# Patient Record
Sex: Male | Born: 1980 | Race: Black or African American | Hispanic: No | Marital: Married | Smoking: Never smoker
Health system: Southern US, Community
[De-identification: ages and names within clinical notes are randomized; demographics above are authoritative.]

## PROBLEM LIST (undated history)

## (undated) DIAGNOSIS — E559 Vitamin D deficiency, unspecified: Secondary | ICD-10-CM

## (undated) DIAGNOSIS — R519 Headache, unspecified: Secondary | ICD-10-CM

## (undated) DIAGNOSIS — F524 Premature ejaculation: Secondary | ICD-10-CM

## (undated) DIAGNOSIS — N529 Male erectile dysfunction, unspecified: Secondary | ICD-10-CM

## (undated) DIAGNOSIS — R51 Headache: Principal | ICD-10-CM

## (undated) DIAGNOSIS — Z8619 Personal history of other infectious and parasitic diseases: Secondary | ICD-10-CM

## (undated) DIAGNOSIS — Z9884 Bariatric surgery status: Secondary | ICD-10-CM

## (undated) DIAGNOSIS — E538 Deficiency of other specified B group vitamins: Secondary | ICD-10-CM

## (undated) DIAGNOSIS — G4733 Obstructive sleep apnea (adult) (pediatric): Secondary | ICD-10-CM

## (undated) HISTORY — DX: Personal history of other infectious and parasitic diseases: Z86.19

## (undated) HISTORY — DX: Vitamin D deficiency, unspecified: E55.9

## (undated) HISTORY — DX: Premature ejaculation: F52.4

## (undated) HISTORY — DX: Deficiency of other specified B group vitamins: E53.8

## (undated) HISTORY — DX: Headache, unspecified: R51.9

## (undated) HISTORY — DX: Headache: R51

## (undated) HISTORY — DX: Male erectile dysfunction, unspecified: N52.9

## (undated) HISTORY — DX: Obstructive sleep apnea (adult) (pediatric): G47.33

## (undated) HISTORY — DX: Bariatric surgery status: Z98.84

---

## 2004-06-15 ENCOUNTER — Emergency Department (HOSPITAL_COMMUNITY): Admission: EM | Admit: 2004-06-15 | Discharge: 2004-06-15 | Payer: Self-pay | Admitting: Family Medicine

## 2004-10-17 ENCOUNTER — Ambulatory Visit: Payer: Self-pay | Admitting: Family Medicine

## 2007-08-05 ENCOUNTER — Emergency Department (HOSPITAL_COMMUNITY): Admission: EM | Admit: 2007-08-05 | Discharge: 2007-08-05 | Payer: Self-pay | Admitting: Emergency Medicine

## 2008-01-02 ENCOUNTER — Emergency Department (HOSPITAL_COMMUNITY): Admission: EM | Admit: 2008-01-02 | Discharge: 2008-01-02 | Payer: Self-pay | Admitting: Emergency Medicine

## 2008-05-14 DIAGNOSIS — Z9884 Bariatric surgery status: Secondary | ICD-10-CM

## 2008-05-14 HISTORY — DX: Bariatric surgery status: Z98.84

## 2008-05-14 HISTORY — PX: GASTRIC BYPASS: SHX52

## 2008-12-01 ENCOUNTER — Encounter: Admission: RE | Admit: 2008-12-01 | Discharge: 2009-03-01 | Payer: Self-pay | Admitting: General Surgery

## 2008-12-09 ENCOUNTER — Ambulatory Visit (HOSPITAL_COMMUNITY): Admission: RE | Admit: 2008-12-09 | Discharge: 2008-12-09 | Payer: Self-pay | Admitting: General Surgery

## 2008-12-23 ENCOUNTER — Encounter: Admission: RE | Admit: 2008-12-23 | Discharge: 2008-12-23 | Payer: Self-pay | Admitting: General Surgery

## 2009-04-14 ENCOUNTER — Encounter: Admission: RE | Admit: 2009-04-14 | Discharge: 2009-05-11 | Payer: Self-pay | Admitting: General Surgery

## 2009-05-02 ENCOUNTER — Inpatient Hospital Stay (HOSPITAL_COMMUNITY): Admission: RE | Admit: 2009-05-02 | Discharge: 2009-05-04 | Payer: Self-pay | Admitting: General Surgery

## 2009-05-03 ENCOUNTER — Ambulatory Visit: Payer: Self-pay | Admitting: Vascular Surgery

## 2009-05-03 ENCOUNTER — Encounter (INDEPENDENT_AMBULATORY_CARE_PROVIDER_SITE_OTHER): Payer: Self-pay | Admitting: General Surgery

## 2009-05-16 ENCOUNTER — Encounter: Admission: RE | Admit: 2009-05-16 | Discharge: 2009-08-14 | Payer: Self-pay | Admitting: General Surgery

## 2009-11-09 ENCOUNTER — Encounter: Admission: RE | Admit: 2009-11-09 | Discharge: 2009-11-09 | Payer: Self-pay | Admitting: General Surgery

## 2010-04-26 ENCOUNTER — Encounter
Admission: RE | Admit: 2010-04-26 | Discharge: 2010-06-13 | Payer: Self-pay | Source: Home / Self Care | Attending: General Surgery | Admitting: General Surgery

## 2010-05-14 DIAGNOSIS — G4733 Obstructive sleep apnea (adult) (pediatric): Secondary | ICD-10-CM

## 2010-05-14 HISTORY — DX: Obstructive sleep apnea (adult) (pediatric): G47.33

## 2010-08-14 LAB — CBC
MCHC: 32.5 g/dL (ref 30.0–36.0)
MCV: 80.3 fL (ref 78.0–100.0)
Platelets: 237 10*3/uL (ref 150–400)
Platelets: 261 10*3/uL (ref 150–400)
RDW: 15 % (ref 11.5–15.5)
WBC: 8.6 10*3/uL (ref 4.0–10.5)
WBC: 8.9 10*3/uL (ref 4.0–10.5)

## 2010-08-14 LAB — DIFFERENTIAL
Basophils Absolute: 0 10*3/uL (ref 0.0–0.1)
Basophils Relative: 0 % (ref 0–1)
Eosinophils Absolute: 0 10*3/uL (ref 0.0–0.7)
Lymphocytes Relative: 20 % (ref 12–46)
Lymphs Abs: 1.7 10*3/uL (ref 0.7–4.0)
Neutro Abs: 5.8 10*3/uL (ref 1.7–7.7)
Neutro Abs: 6.1 10*3/uL (ref 1.7–7.7)
Neutrophils Relative %: 68 % (ref 43–77)

## 2010-08-14 LAB — HEMOGLOBIN AND HEMATOCRIT, BLOOD
HCT: 38.6 % — ABNORMAL LOW (ref 39.0–52.0)
Hemoglobin: 12.4 g/dL — ABNORMAL LOW (ref 13.0–17.0)
Hemoglobin: 12.9 g/dL — ABNORMAL LOW (ref 13.0–17.0)

## 2010-08-15 LAB — CBC
HCT: 43.3 % (ref 39.0–52.0)
Hemoglobin: 14.3 g/dL (ref 13.0–17.0)
MCHC: 33 g/dL (ref 30.0–36.0)
MCV: 80.6 fL (ref 78.0–100.0)
Platelets: 316 10*3/uL (ref 150–400)
RBC: 5.37 MIL/uL (ref 4.22–5.81)
RDW: 15 % (ref 11.5–15.5)
WBC: 7.3 10*3/uL (ref 4.0–10.5)

## 2010-08-15 LAB — COMPREHENSIVE METABOLIC PANEL
ALT: 31 U/L (ref 0–53)
AST: 28 U/L (ref 0–37)
Albumin: 3.7 g/dL (ref 3.5–5.2)
Alkaline Phosphatase: 61 U/L (ref 39–117)
BUN: 15 mg/dL (ref 6–23)
CO2: 28 mEq/L (ref 19–32)
Calcium: 9.6 mg/dL (ref 8.4–10.5)
Chloride: 105 mEq/L (ref 96–112)
Creatinine, Ser: 0.84 mg/dL (ref 0.4–1.5)
GFR calc Af Amer: >60 mL/min (ref >60)
GFR calc non Af Amer: >60 mL/min (ref >60)
Glucose, Bld: 96 mg/dL (ref 70–99)
Potassium: 4.5 mEq/L (ref 3.5–5.1)
Sodium: 138 mEq/L (ref 135–145)
Total Bilirubin: 0.7 mg/dL (ref 0.3–1.2)
Total Protein: 7.5 g/dL (ref 6.0–8.3)

## 2010-08-15 LAB — DIFFERENTIAL
Basophils Absolute: 0 10*3/uL (ref 0.0–0.1)
Basophils Relative: 0 % (ref 0–1)
Monocytes Absolute: 0.5 10*3/uL (ref 0.1–1.0)
Neutro Abs: 4.8 10*3/uL (ref 1.7–7.7)

## 2012-02-04 ENCOUNTER — Telehealth (INDEPENDENT_AMBULATORY_CARE_PROVIDER_SITE_OTHER): Payer: Self-pay | Admitting: General Surgery

## 2012-02-04 NOTE — Telephone Encounter (Signed)
I left a voicemail for the patient and requested patient contact [facility] at [phone] to schedule a bariatric surgery follow-up appointment...cef °

## 2012-07-09 ENCOUNTER — Encounter (HOSPITAL_COMMUNITY): Payer: Self-pay | Admitting: Emergency Medicine

## 2012-07-09 ENCOUNTER — Emergency Department (HOSPITAL_COMMUNITY)
Admission: EM | Admit: 2012-07-09 | Discharge: 2012-07-09 | Disposition: A | Discharge status: Home / Self Care | Payer: BC Managed Care – PPO | Attending: Emergency Medicine | Admitting: Emergency Medicine

## 2012-07-09 DIAGNOSIS — Z79899 Other long term (current) drug therapy: Secondary | ICD-10-CM | POA: Insufficient documentation

## 2012-07-09 DIAGNOSIS — F528 Other sexual dysfunction not due to a substance or known physiological condition: Secondary | ICD-10-CM | POA: Insufficient documentation

## 2012-07-09 DIAGNOSIS — Z9884 Bariatric surgery status: Secondary | ICD-10-CM | POA: Insufficient documentation

## 2012-07-09 DIAGNOSIS — N4830 Priapism, unspecified: Secondary | ICD-10-CM | POA: Insufficient documentation

## 2012-07-09 MED ORDER — PHENYLEPHRINE 200 MCG/ML FOR PRIAPISM / HYPOTENSION
INTRAMUSCULAR | Status: AC
  Administered 2012-07-09: 06:00:00
  Filled 2012-07-09: qty 50

## 2012-07-09 NOTE — ED Provider Notes (Signed)
History     CSN: [id number]  Arrival date & time 07/09/12  1191   First MD Initiated Contact with Patient 07/09/12 [phone]      Chief Complaint  Patient presents with  . Priapism      The history is provided by the patient.   the patient reports taking Viagra for erectile dysfunction approximately 6 hours ago and now he continues to have erections that is painful.  His pain is mild to moderate in severity.  He denies ingestion of other medications.  No history of sickle cell disease.  Nothing improves or worsens the symptoms.   History reviewed. No pertinent past medical history.  Past Surgical History  Procedure Laterality Date  . Gastric bybass  2010    History reviewed. No pertinent family history.  History  Substance Use Topics  . Smoking status: Never Smoker   . Smokeless tobacco: Not on file  . Alcohol Use: No      Review of Systems  All other systems reviewed and are negative.    Allergies  Review of patient's allergies indicates no known allergies.  Home Medications   Current Outpatient Rx  Name  Route  Sig  Dispense  Refill  . sildenafil (VIAGRA) 100 MG tablet   Oral   Take 100 mg by mouth daily as needed for erectile dysfunction.           BP 142/91  Pulse 51  Temp(Src) 99.1 F (37.3 C) (Oral)  Resp 19  Ht 5\' 10"  (1.778 m)  Wt 245 lb (111.131 kg)  BMI 35.15 kg/m2  SpO2 100%  Physical Exam  Nursing note and vitals reviewed. Constitutional: He is oriented to person, place, and time. He appears well-developed and well-nourished.  HENT:  Head: Normocephalic and atraumatic.  Eyes: EOM are normal.  Neck: Normal range of motion.  Cardiovascular: Normal rate, regular rhythm, normal heart sounds and intact distal pulses.   Pulmonary/Chest: Effort normal. No respiratory distress.  Abdominal: Soft.  Genitourinary:  erect nontender penis  Musculoskeletal: Normal range of motion.  Neurological: He is alert and oriented to person, place, and  time.  Skin: Skin is warm and dry.  Psychiatric: He has a normal mood and affect. Judgment normal.    ED Course  Procedures (including critical care time)  PRIAPISM TREATMENT (injection) Patient was prepped and draped in standard sterile fashion Just prior to procedure a timeout was performed Left corpus cavernosum was entered with a 25-gauge syringe Blood was aspirated 1 cc of 200 mcg/ml phenylephrine was injected The patient tolerated the procedure No complications   Labs Reviewed - No data to display No results found.   1. Priapism       MDM  7:00 AM Pt tolerated the procedure and has had complete detumescence of his penis.  Discharge home with urology followup.         Lyanne Co, MD 07/09/12 [phone]

## 2012-07-09 NOTE — ED Notes (Signed)
Pt discharged.Vital signs stable and GCS 15 

## 2012-07-09 NOTE — ED Notes (Signed)
Pt took one 50 mg tablet of viagra at midnight. Pt states his erection has not decreased and pain started around 3:30.. Pt states this has never happened before.

## 2013-01-21 ENCOUNTER — Telehealth: Payer: Self-pay | Admitting: Family Medicine

## 2013-01-21 NOTE — Telephone Encounter (Signed)
Pt is Natasha's ([facility]) and is trying to est w/you as a new pt. Your 1st new pt apptmt is not until February 19, 2013, but he is having headaches and wants to know if he can be seen sooner.  Can you accommodate him a new pt apptmt prior to 02/19/2013? Thank you.

## 2013-01-22 NOTE — Telephone Encounter (Signed)
Sch pt for 01/26/2013

## 2013-01-22 NOTE — Telephone Encounter (Signed)
May place at 2:15 on Monday 9/15 for 30 min slot.

## 2013-01-26 ENCOUNTER — Ambulatory Visit (INDEPENDENT_AMBULATORY_CARE_PROVIDER_SITE_OTHER): Payer: BC Managed Care – PPO | Admitting: Family Medicine

## 2013-01-26 ENCOUNTER — Encounter: Payer: Self-pay | Admitting: Family Medicine

## 2013-01-26 VITALS — BP 130/90 | HR 82 | Temp 98.4°F | Ht 69.5 in | Wt 257.0 lb

## 2013-01-26 DIAGNOSIS — R519 Headache, unspecified: Secondary | ICD-10-CM

## 2013-01-26 DIAGNOSIS — R42 Dizziness and giddiness: Secondary | ICD-10-CM

## 2013-01-26 DIAGNOSIS — G43909 Migraine, unspecified, not intractable, without status migrainosus: Secondary | ICD-10-CM | POA: Insufficient documentation

## 2013-01-26 DIAGNOSIS — R51 Headache: Secondary | ICD-10-CM

## 2013-01-26 DIAGNOSIS — Z9884 Bariatric surgery status: Secondary | ICD-10-CM

## 2013-01-26 DIAGNOSIS — E669 Obesity, unspecified: Secondary | ICD-10-CM

## 2013-01-26 DIAGNOSIS — Z23 Encounter for immunization: Secondary | ICD-10-CM

## 2013-01-26 LAB — COMPREHENSIVE METABOLIC PANEL
ALT: 17 U/L (ref 0–53)
AST: 22 U/L (ref 0–37)
Calcium: 8.8 mg/dL (ref 8.4–10.5)
Chloride: 110 mEq/L (ref 96–112)
Creatinine, Ser: 1 mg/dL (ref 0.4–1.5)
Total Bilirubin: 0.4 mg/dL (ref 0.3–1.2)

## 2013-01-26 LAB — CBC WITH DIFFERENTIAL/PLATELET
Basophils Relative: 0.4 % (ref 0.0–3.0)
Eosinophils Absolute: 0.1 10*3/uL (ref 0.0–0.7)
HCT: 39.3 % (ref 39.0–52.0)
Hemoglobin: 13.2 g/dL (ref 13.0–17.0)
Lymphs Abs: 2.1 10*3/uL (ref 0.7–4.0)
MCHC: 33.7 g/dL (ref 30.0–36.0)
MCV: 83.8 fl (ref 78.0–100.0)
Monocytes Absolute: 0.6 10*3/uL (ref 0.1–1.0)
Neutro Abs: 4.6 10*3/uL (ref 1.4–7.7)
RBC: 4.69 Mil/uL (ref 4.22–5.81)

## 2013-01-26 MED ORDER — CYCLOBENZAPRINE HCL 10 MG PO TABS: 10.0000 mg | ORAL_TABLET | Freq: Three times a day (TID) | ORAL | Status: DC | PRN

## 2013-01-26 NOTE — Addendum Note (Signed)
Addended by: Josph Macho A on: 01/26/2013 03:15 PM   Modules accepted: Orders

## 2013-01-26 NOTE — Patient Instructions (Addendum)
Let's keep an eye on blood pressure when you go to local pharmacy - goal <140/90, let me know if consistently staying higher. Headache could be due to several factors . Start with decreasing sertraline to 50mg  daily (1/2 tablet).  Then fill new prescription for glasses. May use flexeril 10mg  as needed for headache (1/2 to 1 tablet at a time - sedation precautions) Return in 3-4 months for physical and f/u headaches. Sign release of records from [place].

## 2013-01-26 NOTE — Assessment & Plan Note (Signed)
Check orthostatics today. Sound more like lightheadedness.

## 2013-01-26 NOTE — Assessment & Plan Note (Signed)
?  atypical migraine vs SSRI related vs vision related vs less likely TTH (as no neck pain).  Pt states sleep study after bypass WNL Check blood work today Trial of flexeril muscle relaxant prn headache - discussed sedation precautions.

## 2013-01-26 NOTE — Assessment & Plan Note (Signed)
Check vitamin levels 

## 2013-01-26 NOTE — Progress Notes (Signed)
Subjective:    Patient ID: Vincent Diaz, male    DOB: [birth year], 32 y.o.   MRN: [id number]  HPI CC: new patient to establish  Prior saw Dr. Cheri Guppy at [facility]  HA - longterm, increased frequency over last 6 months. Describes pounding bilateral temporoparietal headache.  Photo and phonophobia.  At times wakes up with headaches. No nausea. No vision changes.  Improved with ibuprofen - 800mg .  One headache every other week.  Pseudophed with benadryl helps some.  Rest helps the most.  Last 1-4 days.  Activity limiting headache. Has been more active in heat recently. No significant caffeine use.  Rare sodas.  Sleeping 6-8 hours regularly.  No neck pain. L eye astigmatist.  Last saw eye doctor a few months ago.  New prescription but he hasn't filled yet.  Dizziness - may come on intermittently.  Described as "wobbly and woozy" with blurred vision - lightheaded.  No vertigo sxs.  No presyncope or syncope.  Not positional dizziness.  Doesn't wake up feeling rested. Doesn't snore. H/o OSA - improved with surgery.  Had 2nd normal sleep study.  Wt Readings from Last 3 Encounters:  01/26/13 257 lb (116.574 kg)  07/09/12 245 lb (111.131 kg)  10lb weight gain over last few months.  Preventative:  Due for CPE, blood work > 1 yr ago Tetanus - unsure, thinks within last 10 yrs Flu shot - declines.  Lives with wife and 2 children (2, 5, 5, 8) Occupation: Radio producer (Paediatric nurse) Edu: Masters in IT Activity: coaches youth football (8yo), some working out  Diet: some water, fruits/vegetables daily  Medications and allergies reviewed and updated in chart.  Past histories reviewed and updated if relevant as below. There are no active problems to display for this patient.  Past Medical History  Diagnosis Date  . History of chicken pox   . Headache   . Premature ejaculation     Dr. Evelene Croon   Past Surgical History  Procedure Laterality Date  . Gastric bypass  2010    Roux en  Y Jeanella Flattery)   History  Substance Use Topics  . Smoking status: Never Smoker   . Smokeless tobacco: Never Used  . Alcohol Use: Yes     Comment: seldom   Family History  Problem Relation Age of Onset  . Hypertension Paternal Grandmother   . Cancer Maternal Aunt     breast  . Drug abuse Mother     h/o  . CAD Neg Hx   . Stroke Neg Hx   . Diabetes Neg Hx    No Known Allergies No current outpatient prescriptions on file prior to visit.   No current facility-administered medications on file prior to visit.     Review of Systems  Constitutional: Negative for fever, chills, activity change, appetite change, fatigue and unexpected weight change.  HENT: Negative for hearing loss and neck pain.   Eyes: Negative for visual disturbance.  Respiratory: Negative for cough, chest tightness, shortness of breath and wheezing.   Cardiovascular: Negative for chest pain, palpitations and leg swelling.  Gastrointestinal: Negative for nausea, vomiting, abdominal pain, diarrhea, constipation, blood in stool and abdominal distention.  Genitourinary: Negative for hematuria and difficulty urinating.  Musculoskeletal: Negative for myalgias and arthralgias.  Skin: Negative for rash.  Neurological: Positive for dizziness, light-headedness and headaches (frequent - see HPI). Negative for seizures and syncope.  Hematological: Negative for adenopathy. Does not bruise/bleed easily.  Psychiatric/Behavioral: Negative for dysphoric mood. The patient is not  nervous/anxious.        Objective:   Physical Exam  Nursing note and vitals reviewed. Constitutional: He is oriented to person, place, and time. He appears well-developed and well-nourished. No distress.  HENT:  Head: Normocephalic and atraumatic.  Right Ear: Hearing, tympanic membrane, external ear and ear canal normal.  Left Ear: Hearing, tympanic membrane, external ear and ear canal normal.  Nose: Nose normal.  Mouth/Throat: Oropharynx is clear  and moist. No oropharyngeal exudate.  Eyes: Conjunctivae and EOM are normal. Pupils are equal, round, and reactive to light. No scleral icterus.  Neck: Normal range of motion. Neck supple. No thyromegaly present.  Cardiovascular: Normal rate, regular rhythm, normal heart sounds and intact distal pulses.   No murmur heard. Pulses:      Radial pulses are 2+ on the right side, and 2+ on the left side.  Pulmonary/Chest: Effort normal and breath sounds normal. No respiratory distress. He has no wheezes. He has no rales.  Musculoskeletal: Normal range of motion. He exhibits no edema.  Lymphadenopathy:    He has no cervical adenopathy.  Neurological: He is alert and oriented to person, place, and time. He has normal strength. No cranial nerve deficit or sensory deficit. He displays a negative Romberg sign. Coordination and gait normal.  CN grossly intact, station and gait intact Normal FTN  Skin: Skin is warm and dry. No rash noted.  Psychiatric: He has a normal mood and affect. His behavior is normal. Judgment and thought content normal.       Assessment & Plan:

## 2013-01-26 NOTE — Assessment & Plan Note (Signed)
Body mass index is 37.42 kg/(m^2). will need to closely monitor weight after bypass - weight gain noted.

## 2013-01-27 ENCOUNTER — Other Ambulatory Visit: Payer: Self-pay | Admitting: Family Medicine

## 2013-01-27 ENCOUNTER — Encounter: Payer: Self-pay | Admitting: Family Medicine

## 2013-01-27 DIAGNOSIS — E559 Vitamin D deficiency, unspecified: Secondary | ICD-10-CM | POA: Insufficient documentation

## 2013-01-27 DIAGNOSIS — E538 Deficiency of other specified B group vitamins: Secondary | ICD-10-CM | POA: Insufficient documentation

## 2013-01-27 DIAGNOSIS — F524 Premature ejaculation: Secondary | ICD-10-CM | POA: Insufficient documentation

## 2013-01-27 MED ORDER — VITAMIN D 50 MCG (2000 UT) PO CAPS: 1.0000 | ORAL_CAPSULE | Freq: Every day | ORAL | Status: DC

## 2013-01-29 ENCOUNTER — Ambulatory Visit (INDEPENDENT_AMBULATORY_CARE_PROVIDER_SITE_OTHER): Payer: BC Managed Care – PPO | Admitting: *Deleted

## 2013-01-29 DIAGNOSIS — E538 Deficiency of other specified B group vitamins: Secondary | ICD-10-CM

## 2013-01-29 MED ORDER — CYANOCOBALAMIN 1000 MCG/ML IJ SOLN
1000.0000 ug | Freq: Once | INTRAMUSCULAR | Status: AC
  Administered 2013-01-29: 1000 ug via INTRAMUSCULAR

## 2013-02-25 ENCOUNTER — Encounter: Payer: Self-pay | Admitting: Family Medicine

## 2013-03-03 ENCOUNTER — Ambulatory Visit (INDEPENDENT_AMBULATORY_CARE_PROVIDER_SITE_OTHER): Payer: BC Managed Care – PPO | Admitting: *Deleted

## 2013-03-03 DIAGNOSIS — E538 Deficiency of other specified B group vitamins: Secondary | ICD-10-CM

## 2013-03-03 MED ORDER — CYANOCOBALAMIN 1000 MCG/ML IJ SOLN
1000.0000 ug | Freq: Once | INTRAMUSCULAR | Status: AC
  Administered 2013-03-03: 1000 ug via INTRAMUSCULAR

## 2013-04-21 ENCOUNTER — Emergency Department (HOSPITAL_COMMUNITY)
Admission: EM | Admit: 2013-04-21 | Discharge: 2013-04-21 | Disposition: A | Discharge status: Home / Self Care | Payer: BC Managed Care – PPO | Attending: Emergency Medicine | Admitting: Emergency Medicine

## 2013-04-21 ENCOUNTER — Encounter: Payer: Self-pay | Admitting: Cardiology

## 2013-04-21 ENCOUNTER — Emergency Department (HOSPITAL_COMMUNITY): Payer: BC Managed Care – PPO

## 2013-04-21 ENCOUNTER — Encounter (HOSPITAL_COMMUNITY): Payer: Self-pay | Admitting: Emergency Medicine

## 2013-04-21 DIAGNOSIS — Z8639 Personal history of other endocrine, nutritional and metabolic disease: Secondary | ICD-10-CM | POA: Insufficient documentation

## 2013-04-21 DIAGNOSIS — R079 Chest pain, unspecified: Secondary | ICD-10-CM | POA: Insufficient documentation

## 2013-04-21 DIAGNOSIS — Z9884 Bariatric surgery status: Secondary | ICD-10-CM | POA: Insufficient documentation

## 2013-04-21 DIAGNOSIS — Z79899 Other long term (current) drug therapy: Secondary | ICD-10-CM | POA: Insufficient documentation

## 2013-04-21 DIAGNOSIS — R6883 Chills (without fever): Secondary | ICD-10-CM | POA: Insufficient documentation

## 2013-04-21 DIAGNOSIS — R42 Dizziness and giddiness: Secondary | ICD-10-CM | POA: Insufficient documentation

## 2013-04-21 DIAGNOSIS — Z8619 Personal history of other infectious and parasitic diseases: Secondary | ICD-10-CM | POA: Insufficient documentation

## 2013-04-21 DIAGNOSIS — R059 Cough, unspecified: Secondary | ICD-10-CM | POA: Insufficient documentation

## 2013-04-21 DIAGNOSIS — R05 Cough: Secondary | ICD-10-CM | POA: Insufficient documentation

## 2013-04-21 DIAGNOSIS — G4733 Obstructive sleep apnea (adult) (pediatric): Secondary | ICD-10-CM | POA: Insufficient documentation

## 2013-04-21 LAB — COMPREHENSIVE METABOLIC PANEL
AST: 19 U/L (ref 0–37)
Albumin: 3.5 g/dL (ref 3.5–5.2)
BUN: 15 mg/dL (ref 6–23)
Chloride: 104 mEq/L (ref 96–112)
Creatinine, Ser: 0.99 mg/dL (ref 0.50–1.35)
GFR calc non Af Amer: >90 mL/min (ref >90)
Potassium: 3.9 mEq/L (ref 3.5–5.1)
Total Bilirubin: 0.4 mg/dL (ref 0.3–1.2)
Total Protein: 6.6 g/dL (ref 6.0–8.3)

## 2013-04-21 LAB — CBC WITH DIFFERENTIAL/PLATELET
Eosinophils Absolute: 0 10*3/uL (ref 0.0–0.7)
Eosinophils Relative: 1 % (ref 0–5)
HCT: 38 % — ABNORMAL LOW (ref 39.0–52.0)
Lymphocytes Relative: 21 % (ref 12–46)
Lymphs Abs: 1.6 10*3/uL (ref 0.7–4.0)
MCH: 27.9 pg (ref 26.0–34.0)
MCV: 83 fL (ref 78.0–100.0)
Monocytes Absolute: 1 10*3/uL (ref 0.1–1.0)
Monocytes Relative: 14 % — ABNORMAL HIGH (ref 3–12)
Neutro Abs: 4.9 10*3/uL (ref 1.7–7.7)
Platelets: 253 10*3/uL (ref 150–400)
RBC: 4.58 MIL/uL (ref 4.22–5.81)
WBC: 7.5 10*3/uL (ref 4.0–10.5)

## 2013-04-21 LAB — POCT I-STAT TROPONIN I: Troponin i, poc: 0.01 ng/mL (ref 0.00–0.08)

## 2013-04-21 MED ORDER — IBUPROFEN 800 MG PO TABS: 800.0000 mg | ORAL_TABLET | Freq: Three times a day (TID) | ORAL | Status: DC

## 2013-04-21 MED ORDER — FENTANYL CITRATE 0.05 MG/ML IJ SOLN
50.0000 ug | INTRAMUSCULAR | Status: DC | PRN
  Administered 2013-04-21: 50 ug via INTRAVENOUS
  Filled 2013-04-21: qty 2

## 2013-04-21 MED ORDER — PROMETHAZINE HCL 25 MG/ML IJ SOLN
25.0000 mg | Freq: Once | INTRAMUSCULAR | Status: AC
  Administered 2013-04-21: 25 mg via INTRAVENOUS
  Filled 2013-04-21: qty 1

## 2013-04-21 NOTE — ED Provider Notes (Signed)
CSN: [id number]     Arrival date & time 04/21/13  0416 History   First MD Initiated Contact with Patient 04/21/13 0445     Chief Complaint  Patient presents with  . Chest Pain  . Shortness of Breath  . Dizziness  . Cough  . Chills   (Consider location/radiation/quality/duration/timing/severity/associated sxs/prior Treatment) HPI History provided by patient. Sharp left-sided chest pain onset about 4 hours prior to arrival. Pain is moderate in severity and not affected by breathing or movement. No trauma. Some lightheaded dizziness and chills but no cough or difficulty breathing. No leg pain or leg swelling. No nausea vomiting or diarrhea. No history of same. History of sleep apnea. No history of hypertension, diabetes or high cholesterol. Symptoms moderate in severity Past Medical History  Diagnosis Date  . History of chicken pox   . Headache   . Premature ejaculation     Dr. Evelene Croon  . Vitamin D deficiency   . Vitamin B12 deficiency   . 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 OSA (obstructive sleep apnea) 2012    mild, AHI 5.51 during total sleep time, 13.87 during REM sleep, rec CPAP (Dr. Mayford Knife)   Past Surgical History  Procedure Laterality Date  . Gastric bypass  2010    Roux en Y ([facility])   Family History  Problem Relation Age of Onset  . Hypertension Paternal Grandmother   . Cancer Maternal Aunt     breast  . Drug abuse Mother     h/o  . CAD Neg Hx   . Stroke Neg Hx   . Diabetes Neg Hx    History  Substance Use Topics  . Smoking status: Never Smoker   . Smokeless tobacco: Never Used  . Alcohol Use: Yes     Comment: seldom    Review of Systems  Constitutional: Negative for fever and chills.  Eyes: Negative for visual disturbance.  Respiratory: Negative for shortness of breath.   Cardiovascular: Positive for chest pain.  Gastrointestinal: Negative for abdominal pain.  Genitourinary: Negative for dysuria.  Musculoskeletal: Negative for  back pain, neck pain and neck stiffness.  Skin: Negative for rash.  Neurological: Negative for headaches.  All other systems reviewed and are negative.    Allergies  Review of patient's allergies indicates no known allergies.  Home Medications   Current Outpatient Rx  Name  Route  Sig  Dispense  Refill  . sertraline (ZOLOFT) 100 MG tablet   Oral   Take 100 mg by mouth daily.          BP 113/74  Pulse 67  Temp(Src) 98.1 F (36.7 C) (Oral)  Resp 12  SpO2 100% Physical Exam  Constitutional: He is oriented to person, place, and time. He appears well-developed and well-nourished.  HENT:  Head: Normocephalic and atraumatic.  Eyes: EOM are normal. Pupils are equal, round, and reactive to light.  Neck: Neck supple.  Cardiovascular: Normal rate, regular rhythm and intact distal pulses.   Pulmonary/Chest: Effort normal and breath sounds normal. No respiratory distress. He exhibits no tenderness.  Abdominal: Soft. He exhibits no distension. There is no tenderness.  Musculoskeletal: Normal range of motion. He exhibits no edema and no tenderness.  Neurological: He is alert and oriented to person, place, and time.  Skin: Skin is warm and dry.    ED Course  Procedures (including critical care time) Labs Review Labs Reviewed  CBC WITH DIFFERENTIAL - Abnormal; Notable  for the following:    Hemoglobin 12.8 (*)    HCT 38.0 (*)    Monocytes Relative 14 (*)    All other components within normal limits  COMPREHENSIVE METABOLIC PANEL  POCT I-STAT TROPONIN I  POCT I-STAT TROPONIN I   Imaging Review Dg Chest 2 View  04/21/2013   CLINICAL DATA:  Chest pain, shortness of breath, dizziness and cough.  EXAM: CHEST  2 VIEW  COMPARISON:  Chest radiograph performed 12/09/2008  FINDINGS: The lungs are well-aerated. Pulmonary vascularity is at the upper limits of normal. There is no evidence of focal opacification, pleural effusion or pneumothorax.  The heart is normal in size; the mediastinal  contour is within normal limits. No acute osseous abnormalities are seen.  IMPRESSION: No acute cardiopulmonary process seen.   Electronically Signed   By: Roanna Raider M.D.   On: 04/21/2013 05:42    EKG Interpretation    Date/Time:  Tuesday April 21 2013 04:25:40 EST Ventricular Rate:  63 PR Interval:  186 QRS Duration: 103 QT Interval:  394 QTC Calculation: 403 R Axis:   73 Text Interpretation:  Sinus rhythm Nonspecific ST abnormality No significant change since last tracing Confirmed by Zlata Alcaide  MD, Mykia Holton [phone]) on 04/21/2013 4:46:43 AM           IV fentanyl  Serial troponins negative. Pain improved on recheck. Plan discharge home with close outpatient followup for stress testing. Strict return precautions verbalizes understood.  MDM  Diagnosis: Chest pain, low risk for ACS. No hypoxia, tachycardia or PE symptoms otherwise. Improved with medications. Evaluated with EKG, labs and imaging reviewed as above.   Sunnie Nielsen, MD 04/21/13 [phone]

## 2013-04-21 NOTE — ED Notes (Signed)
C/o CP, also sob, dizziness, weakness chills/shivers, onset 4 hrs ago while sleeping, no meds PTA, rates 7/10, (denies: nvd, fever, congestion, cold sx or recent sickness), last ate and last BM "last night".

## 2013-04-22 ENCOUNTER — Encounter: Payer: Self-pay | Admitting: Cardiology

## 2013-04-22 ENCOUNTER — Ambulatory Visit (INDEPENDENT_AMBULATORY_CARE_PROVIDER_SITE_OTHER): Payer: BC Managed Care – PPO

## 2013-04-22 ENCOUNTER — Ambulatory Visit (INDEPENDENT_AMBULATORY_CARE_PROVIDER_SITE_OTHER): Payer: BC Managed Care – PPO | Admitting: Cardiology

## 2013-04-22 VITALS — BP 126/82 | HR 64 | Ht 69.5 in | Wt 264.0 lb

## 2013-04-22 DIAGNOSIS — E538 Deficiency of other specified B group vitamins: Secondary | ICD-10-CM

## 2013-04-22 DIAGNOSIS — R0789 Other chest pain: Secondary | ICD-10-CM

## 2013-04-22 DIAGNOSIS — E669 Obesity, unspecified: Secondary | ICD-10-CM

## 2013-04-22 MED ORDER — CYANOCOBALAMIN 1000 MCG/ML IJ SOLN
1000.0000 ug | Freq: Once | INTRAMUSCULAR | Status: AC
  Administered 2013-04-22: 1000 ug via INTRAMUSCULAR

## 2013-04-22 NOTE — Progress Notes (Signed)
[address] [place], [place]  [address] Phone: [phone] Fax:  [phone]  Date:  04/22/2013   ID:  Zimere R [place], DOB Apr 12, 1981, MRN [id number]  PCP:  [name], MD   History of Present Illness: Brynden R [place] is a 32 y.o. male here for the evaluation of chest pain. He was in emergency department on 04/21/13 with sharp left-sided chest pain which occurred approximately 4 hours prior to arrival and was moderate in severity not affected by breathing or movement. Tightness, central upper. Sharp. Whole torso can hurt at time. Started at about noon. Takes prostaglandin for ED. Felt a little dizziness, and weakness. Sharp pain one min or two but dullness continues. No change with exertion or position. No clear triggers.  No prior history of heart disease, diabetes, hyperlipidemia, hypertension. Had prior gastric bypass surgery. No fevers. Pain is better today with ibuprofen.  Troponin is normal, chest x-ray normal, hemoglobin of 12.8.   Wt Readings from Last 3 Encounters:  04/22/13 264 lb (119.75 kg)  01/26/13 257 lb (116.574 kg)  07/09/12 245 lb (111.131 kg)     Past Medical History  Diagnosis Date  . History of chicken pox   . Headache   . Premature ejaculation     Dr. Evelene Croon  . Vitamin D deficiency   . Vitamin B12 deficiency   . 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 OSA (obstructive sleep apnea) 2012    mild, AHI 5.51 during total sleep time, 13.87 during REM sleep, rec CPAP (Dr. Mayford Knife)    Past Surgical History  Procedure Laterality Date  . Gastric bypass  2010    Roux en Y ([facility])    Current Outpatient Prescriptions  Medication Sig Dispense Refill  . ibuprofen (ADVIL,MOTRIN) 800 MG tablet Take 1 tablet (800 mg total) by mouth 3 (three) times daily.  21 tablet  0  . sertraline (ZOLOFT) 100 MG tablet Take 100 mg by mouth daily.       No current facility-administered medications for this visit.     Allergies:   No Known Allergies  Social History:  The patient  reports that he has never smoked. He has never used smokeless tobacco. He reports that he drinks alcohol. He reports that he does not use illicit drugs.   Family History  Problem Relation Age of Onset  . Hypertension Paternal Grandmother   . Cancer Maternal Aunt     breast  . Drug abuse Mother     h/o  . CAD Neg Hx   . Stroke Neg Hx   . Diabetes Neg Hx     ROS:  Please see the history of present illness.   Denies any fevers, chills, orthopnea, PND, strokelike symptoms, rash.   All other systems reviewed and negative.   PHYSICAL EXAM: VS:  BP 126/82  Pulse 64  Ht 5' 9.5" (1.765 m)  Wt 264 lb (119.75 kg)  BMI 38.44 kg/m2  SpO2 99% Well nourished, well developed, in no acute distress HEENT: normal, [place]/AT, EOMI Neck: no JVD, normal carotid upstroke, no bruit Cardiac:  normal S1, S2; RRR; no murmur Lungs:  clear to auscultation bilaterally, no wheezing, rhonchi or rales Abd: soft, nontender, no hepatomegaly, no bruitsoverweight Ext: no edema, 2+ distal pulses Skin: warm and dry GU: deferred Neuro: no focal abnormalities noted, AAO x 3  EKG:  04/21/13-sinus rhythm, no other abnormalities.  CXR: normal Creatinine 0.9, hemoglobin 12.8, TSH 1.65. No recent LDL Prior emergency room visit reviewed.  ASSESSMENT AND PLAN:  1. Chest pain - atypical, sharp, likely musculoskeletal. I agree with current treatment strategy of ibuprofen 800 mg 3 times a day to be taken with meals for the next 3 or 4 days. Hopefully pain will subside. Stretching exercises can be helpful as well. Possible costochondritis. I also discussed the possibility of a GI component. It sounds less likely given that it is not exacerbated or relieved with food. He still has his gallbladder in place. If symptoms worsen or point in the direction of GI especially with his prior gastric bypass surgery, further evaluation may be warranted. I also suggested to  him that he have his LDL cholesterol/lipid panel checked. He states that he will be seeing his primary physician Dr. Sharen Hones next week. My suspicion for PE is very low, my suspicion for coronary artery disease is also low. No clear evidence of pericarditis. No fevers. Chest x-ray, troponin, EKG all reassuring. No further cardiac testing at this time. He will let me know if his symptoms become more worrisome. 2. Obesity-post gastric bypass surgery. Continue with weight loss, exercise.  Signed, Donato Schultz, MD [facility]  04/22/2013 11:18 AM

## 2013-04-22 NOTE — Patient Instructions (Signed)
Your physician recommends that you continue on your current medications as directed. Please refer to the Current Medication list given to you today.  Your physician recommends that you follow-up as needed.  

## 2013-04-27 ENCOUNTER — Encounter: Payer: Self-pay | Admitting: Family Medicine

## 2013-04-27 ENCOUNTER — Ambulatory Visit (INDEPENDENT_AMBULATORY_CARE_PROVIDER_SITE_OTHER): Payer: BC Managed Care – PPO | Admitting: Family Medicine

## 2013-04-27 VITALS — BP 108/76 | HR 72 | Temp 98.6°F | Ht 69.5 in | Wt 265.2 lb

## 2013-04-27 DIAGNOSIS — R519 Headache, unspecified: Secondary | ICD-10-CM

## 2013-04-27 DIAGNOSIS — E538 Deficiency of other specified B group vitamins: Secondary | ICD-10-CM

## 2013-04-27 DIAGNOSIS — E559 Vitamin D deficiency, unspecified: Secondary | ICD-10-CM

## 2013-04-27 DIAGNOSIS — Z9884 Bariatric surgery status: Secondary | ICD-10-CM

## 2013-04-27 DIAGNOSIS — Z0000 Encounter for general adult medical examination without abnormal findings: Secondary | ICD-10-CM | POA: Insufficient documentation

## 2013-04-27 DIAGNOSIS — E669 Obesity, unspecified: Secondary | ICD-10-CM

## 2013-04-27 DIAGNOSIS — R51 Headache: Secondary | ICD-10-CM

## 2013-04-27 DIAGNOSIS — N529 Male erectile dysfunction, unspecified: Secondary | ICD-10-CM

## 2013-04-27 LAB — LIPID PANEL
Cholesterol: 134 mg/dL (ref 0–200)
HDL: 42.6 mg/dL (ref >39.00)
LDL Cholesterol: 86 mg/dL (ref 0–99)
Triglycerides: 25 mg/dL (ref 0.0–149.0)
VLDL: 5 mg/dL (ref 0.0–40.0)

## 2013-04-27 LAB — IBC PANEL
Iron: 117 ug/dL (ref 42–165)
Saturation Ratios: 43.4 % (ref 20.0–50.0)
Transferrin: 192.7 mg/dL — ABNORMAL LOW (ref 212.0–360.0)

## 2013-04-27 MED ORDER — VITAMIN D3 50 MCG (2000 UT) PO CAPS: 2000.0000 [IU] | ORAL_CAPSULE | Freq: Every day | ORAL | Status: DC

## 2013-04-27 MED ORDER — SILDENAFIL CITRATE 100 MG PO TABS: 50.0000 mg | ORAL_TABLET | Freq: Every day | ORAL | Status: DC | PRN

## 2013-04-27 NOTE — Progress Notes (Signed)
Pre-visit discussion using our clinic review tool. No additional management support is needed unless otherwise documented below in the visit note.  

## 2013-04-27 NOTE — Assessment & Plan Note (Signed)
Preventative protocols reviewed and updated unless pt declined. Discussed healthy diet and lifestyle.  

## 2013-04-27 NOTE — Assessment & Plan Note (Signed)
viagra refilled.  

## 2013-04-27 NOTE — Assessment & Plan Note (Signed)
Overall stable.  No longer bothering him.  Never tried flexeril, never decreased sertraline.

## 2013-04-27 NOTE — Assessment & Plan Note (Signed)
Reviewed importance of starting vit D and continuing B12 shots.

## 2013-04-27 NOTE — Progress Notes (Signed)
Subjective:    Patient ID: Vincent Diaz, male    DOB: December 10, 1980, 32 y.o.   MRN: [id number]  HPI CC: CPE, f/u HA  Keoni was evaluated last week at the ER with atypical chest pain - r/o ACS with serial enzymes and EKG, CXR was normal, then discharged with close f/u with cardiology.  Saw Dr. Elita Boone last week, thought more consistent with MSK pain and continued treatment with ibuprofen 600mg  tid.  Overall doing better from this.  Has stopped NSAIDs.  In retrospect, chest pain associated with dizziness and weakness started a few days after stopping sertraline 100mg  cold [place].  This could have all been related.  Chief presents today for a physical exam and for f/u of headaches.  Per prior visit:  HA - longterm, increased frequency over last 6 months. Describes pounding bilateral temporoparietal headache. Photo and phonophobia. At times wakes up with headaches. No nausea. No vision changes. Improved with ibuprofen - 800mg . One headache every other week. Pseudophed with benadryl helps some. Rest helps the most. Last 1-4 days. Activity limiting headache.  Has been more active in heat recently.  No significant caffeine use. Rare sodas. Sleeping 6-8 hours regularly. No neck pain.  L eye astigmatist. Last saw eye doctor a few months ago. New prescription but he hasn't filled yet. To treat headaches, sertraline was decreased to 50mg  daily and pt was prescribed flexeril to use instead of ibuprofen.  HAs have actually been relatively stable.  Never decreased sertraline dose.  Never filled flexeril.  HA not currently   Preventative:  Due for CPE blood work reviewed.  He had recently normal CMP, CBC at ER except for mild anemia with Hgb 12.8. Tetanus - unsure, thinks around 2011 Flu shot - 01/26/2013  Lives with wife and 2 children (2, 5, 5, 8)  Occupation: Radio producer Market researcher)  Edu: Masters in IT  Activity: coaches youth football (8yo), some working out Diet: some water,  fruits/vegetables some   Wt Readings from Last 3 Encounters:  04/27/13 265 lb 4 oz (120.317 kg)  04/22/13 264 lb (119.75 kg)  01/26/13 257 lb (116.574 kg)   Body mass index is 38.62 kg/(m^2). H/o gastric bypass.  Nadir 243lbs (summer 2014)  Medications and allergies reviewed and updated in chart.  Past histories reviewed and updated if relevant as below. Patient Active Problem List   Diagnosis Date Noted  . Vitamin D deficiency   . Vitamin B12 deficiency   . Premature ejaculation   . Dizziness 01/26/2013  . Headache 01/26/2013  . Obesity 01/26/2013  . 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 History of chicken pox   . Headache   . Premature ejaculation     Dr. Evelene Croon  . Vitamin D deficiency   . Vitamin B12 deficiency   . 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 OSA (obstructive sleep apnea) 2012    mild, AHI 5.51 during total sleep time, 13.87 during REM sleep, rec CPAP (Dr. Mayford Knife)   Past Surgical History  Procedure Laterality Date  . Gastric bypass  2010    Roux en Y ([facility])   History  Substance Use Topics  . Smoking status: Never Smoker   . Smokeless tobacco: Never Used  . Alcohol Use: Yes     Comment: seldom   Family History  Problem Relation Age of Onset  . Hypertension Paternal Grandmother   .  Cancer Maternal Aunt     breast  . Drug abuse Mother     h/o  . CAD Neg Hx   . Stroke Neg Hx   . Diabetes Neg Hx    No Known Allergies Current Outpatient Prescriptions on File Prior to Visit  Medication Sig Dispense Refill  . ibuprofen (ADVIL,MOTRIN) 800 MG tablet Take 1 tablet (800 mg total) by mouth 3 (three) times daily.  21 tablet  0  . sertraline (ZOLOFT) 100 MG tablet Take 100 mg by mouth daily.       No current facility-administered medications on file prior to visit.     Review of Systems  Constitutional: Negative for fever, chills, activity change, appetite change, fatigue and unexpected weight  change.  HENT: Negative for hearing loss.   Eyes: Negative for visual disturbance.  Respiratory: Negative for cough, chest tightness, shortness of breath and wheezing.   Cardiovascular: Negative for chest pain, palpitations and leg swelling.  Gastrointestinal: Negative for nausea, vomiting, abdominal pain, diarrhea, constipation, blood in stool and abdominal distention.  Genitourinary: Negative for hematuria and difficulty urinating.  Musculoskeletal: Negative for arthralgias, myalgias and neck pain.  Skin: Negative for rash.  Neurological: Positive for headaches. Negative for dizziness, seizures and syncope.  Hematological: Negative for adenopathy. Does not bruise/bleed easily.  Psychiatric/Behavioral: Negative for dysphoric mood. The patient is not nervous/anxious.        Objective:   Physical Exam  Nursing note and vitals reviewed. Constitutional: He is oriented to person, place, and time. He appears well-developed and well-nourished. No distress.  HENT:  Head: Normocephalic and atraumatic.  Right Ear: Hearing, tympanic membrane, external ear and ear canal normal.  Left Ear: Hearing, tympanic membrane, external ear and ear canal normal.  Nose: Nose normal. No mucosal edema or rhinorrhea.  Mouth/Throat: Uvula is midline, oropharynx is clear and moist and mucous membranes are normal. No oropharyngeal exudate, posterior oropharyngeal edema, posterior oropharyngeal erythema or tonsillar abscesses.  Eyes: Conjunctivae and EOM are normal. Pupils are equal, round, and reactive to light. No scleral icterus.  Neck: Normal range of motion. Neck supple. No thyromegaly present.  Cardiovascular: Normal rate, regular rhythm, normal heart sounds and intact distal pulses.   No murmur heard. Pulses:      Radial pulses are 2+ on the right side, and 2+ on the left side.  Pulmonary/Chest: Effort normal and breath sounds normal. No respiratory distress. He has no wheezes. He has no rales.  Abdominal:  Soft. Bowel sounds are normal. He exhibits no distension and no mass. There is no tenderness. There is no rebound and no guarding. Hernia confirmed negative in the right inguinal area and confirmed negative in the left inguinal area.  Genitourinary: Penis normal. Right testis shows no mass, no swelling and no tenderness. Right testis is descended. Left testis shows no mass, no swelling and no tenderness. Left testis is descended. Uncircumcised. No penile tenderness.  Some decreased testicular mass bilaterally  Musculoskeletal: Normal range of motion. He exhibits no edema.  Lymphadenopathy:    He has no cervical adenopathy.       Right: No inguinal adenopathy present.       Left: No inguinal adenopathy present.  Neurological: He is alert and oriented to person, place, and time.  CN grossly intact, station and gait intact  Skin: Skin is warm and dry. No rash noted.  Psychiatric: He has a normal mood and affect. His behavior is normal. Judgment and thought content normal.  Assessment & Plan:

## 2013-04-27 NOTE — Assessment & Plan Note (Signed)
weight gain over last several months - encouraged him to stay active and eat healthy for goal weight loss.

## 2013-04-27 NOTE — Patient Instructions (Addendum)
Work on regular activity even in the winter months. Watch diet and weight closely.   Blood work today - especially to check cholesterol levels and vitamin levels. Good to see you today, call us with questions.

## 2013-04-28 LAB — FERRITIN: Ferritin: 116.2 ng/mL (ref 22.0–322.0)

## 2013-04-28 LAB — VITAMIN D 25 HYDROXY (VIT D DEFICIENCY, FRACTURES): Vit D, 25-Hydroxy: 14 ng/mL — ABNORMAL LOW (ref 30–89)

## 2013-05-01 ENCOUNTER — Encounter: Payer: Self-pay | Admitting: *Deleted

## 2013-05-26 ENCOUNTER — Ambulatory Visit (INDEPENDENT_AMBULATORY_CARE_PROVIDER_SITE_OTHER): Payer: BC Managed Care – PPO | Admitting: *Deleted

## 2013-05-26 DIAGNOSIS — E538 Deficiency of other specified B group vitamins: Secondary | ICD-10-CM

## 2013-05-26 MED ORDER — CYANOCOBALAMIN 1000 MCG/ML IJ SOLN
1000.0000 ug | Freq: Once | INTRAMUSCULAR | Status: AC
  Administered 2013-05-26: 1000 ug via INTRAMUSCULAR

## 2013-07-01 ENCOUNTER — Ambulatory Visit (INDEPENDENT_AMBULATORY_CARE_PROVIDER_SITE_OTHER): Payer: BC Managed Care – PPO | Admitting: *Deleted

## 2013-07-01 DIAGNOSIS — E538 Deficiency of other specified B group vitamins: Secondary | ICD-10-CM

## 2013-07-01 MED ORDER — CYANOCOBALAMIN 1000 MCG/ML IJ SOLN
1000.0000 ug | Freq: Once | INTRAMUSCULAR | Status: AC
  Administered 2013-07-01: 1000 ug via INTRAMUSCULAR

## 2013-07-18 ENCOUNTER — Encounter: Payer: Self-pay | Admitting: Family Medicine

## 2013-07-18 ENCOUNTER — Ambulatory Visit (INDEPENDENT_AMBULATORY_CARE_PROVIDER_SITE_OTHER): Payer: BC Managed Care – PPO | Admitting: Family Medicine

## 2013-07-18 VITALS — BP 120/70 | HR 58 | Temp 98.4°F | Ht 69.5 in | Wt 264.8 lb

## 2013-07-18 DIAGNOSIS — K529 Noninfective gastroenteritis and colitis, unspecified: Secondary | ICD-10-CM | POA: Insufficient documentation

## 2013-07-18 DIAGNOSIS — R112 Nausea with vomiting, unspecified: Secondary | ICD-10-CM

## 2013-07-18 MED ORDER — PROMETHAZINE HCL 25 MG PO TABS: 25.0000 mg | ORAL_TABLET | Freq: Three times a day (TID) | ORAL | Status: DC | PRN

## 2013-07-18 NOTE — Patient Instructions (Signed)
Probiotics daily such as Digestive Advantage gummies daily  24 hours of clear fluids then Diet for Diarrhea, Adult Frequent, runny stools (diarrhea) may be caused or worsened by food or drink. Diarrhea may be relieved by changing your diet. Since diarrhea can last up to 7 days, it is easy for you to lose too much fluid from the body and become dehydrated. Fluids that are lost need to be replaced. Along with a modified diet, make sure you drink enough fluids to keep your urine clear or pale yellow. DIET INSTRUCTIONS  Ensure adequate fluid intake (hydration): have 1 cup (8 oz) of fluid for each diarrhea episode. Avoid fluids that contain simple sugars or sports drinks, fruit juices, whole milk products, and sodas. Your urine should be clear or pale yellow if you are drinking enough fluids. Hydrate with an oral rehydration solution that you can purchase at pharmacies, retail stores, and online. You can prepare an oral rehydration solution at home by mixing the following ingredients together:    tsp table salt.   tsp baking soda.   tsp salt substitute containing potassium chloride.  1  tablespoons sugar.  1 L (34 oz) of water.  Certain foods and beverages may increase the speed at which food moves through the gastrointestinal (GI) tract. These foods and beverages should be avoided and include:  Caffeinated and alcoholic beverages.  High-fiber foods, such as raw fruits and vegetables, nuts, seeds, and whole grain breads and cereals.  Foods and beverages sweetened with sugar alcohols, such as xylitol, sorbitol, and mannitol.  Some foods may be well tolerated and may help thicken stool including:  Starchy foods, such as rice, toast, pasta, low-sugar cereal, oatmeal, grits, baked potatoes, crackers, and bagels.   Bananas.   Applesauce.  Add probiotic-rich foods to help increase healthy bacteria in the GI tract, such as yogurt and fermented milk products. RECOMMENDED FOODS AND  BEVERAGES Starches Choose foods with less than 2 g of fiber per serving.  Recommended:  White, [place], and pita breads, plain rolls, buns, bagels. Plain muffins, matzo. Soda, saltine, or graham crackers. Pretzels, melba toast, zwieback. Cooked cereals made with water: cornmeal, farina, cream cereals. Dry cereals: refined corn, wheat, rice. Potatoes prepared any way without skins, refined macaroni, spaghetti, noodles, refined rice.  Avoid:  Bread, rolls, or crackers made with whole wheat, multi-grains, rye, bran seeds, nuts, or coconut. Corn tortillas or taco shells. Cereals containing whole grains, multi-grains, bran, coconut, nuts, raisins. Cooked or dry oatmeal. Coarse wheat cereals, granola. Cereals advertised as "high-fiber." Potato skins. Whole grain pasta, wild or brown rice. Popcorn. Sweet potatoes, yams. Sweet rolls, doughnuts, waffles, pancakes, sweet breads. Vegetables  Recommended: Strained tomato and vegetable juices. Most well-cooked and canned vegetables without seeds. Fresh: Tender lettuce, cucumber without the skin, cabbage, spinach, bean sprouts.  Avoid: Fresh, cooked, or canned: Artichokes, baked beans, beet greens, broccoli, Brussels sprouts, corn, kale, legumes, peas, sweet potatoes. Cooked: Green or red cabbage, spinach. Avoid large servings of any vegetables because vegetables shrink when cooked, and they contain more fiber per serving than fresh vegetables. Fruit  Recommended: Cooked or canned: Apricots, applesauce, cantaloupe, cherries, fruit cocktail, grapefruit, grapes, kiwi, mandarin oranges, peaches, pears, plums, watermelon. Fresh: Apples without skin, ripe banana, grapes, cantaloupe, cherries, grapefruit, peaches, oranges, plums. Keep servings limited to  cup or 1 piece.  Avoid: Fresh: Apples with skin, apricots, mangoes, pears, raspberries, strawberries. Prune juice, stewed or dried prunes. Dried fruits, raisins, dates. Large servings of all fresh  fruits. Protein  Recommended:  Ground or well-cooked tender beef, ham, veal, lamb, pork, or poultry. Eggs. Fish, oysters, shrimp, lobster, other seafoods. Liver, organ meats.  Avoid: Tough, fibrous meats with gristle. Peanut butter, smooth or chunky. Cheese, nuts, seeds, legumes, dried peas, beans, lentils. Dairy  Recommended: Yogurt, lactose-free milk, kefir, drinkable yogurt, buttermilk, soy milk, or plain hard cheese.  Avoid: Milk, chocolate milk, beverages made with milk, such as milkshakes. Soups  Recommended: Bouillon, broth, or soups made from allowed foods. Any strained soup.  Avoid: Soups made from vegetables that are not allowed, cream or milk-based soups. Desserts and Sweets  Recommended: Sugar-free gelatin, sugar-free frozen ice pops made without sugar alcohol.  Avoid: Plain cakes and cookies, pie made with fruit, pudding, custard, cream pie. Gelatin, fruit, ice, sherbet, frozen ice pops. Ice cream, ice milk without nuts. Plain hard candy, honey, jelly, molasses, syrup, sugar, chocolate syrup, gumdrops, marshmallows. Fats and Oils  Recommended: Limit fats to less than 8 tsp per day.  Avoid: Seeds, nuts, olives, avocados. Margarine, butter, cream, mayonnaise, salad oils, plain salad dressings. Plain gravy, crisp bacon without rind. Beverages  Recommended: Water, decaffeinated teas, oral rehydration solutions, sugar-free beverages not sweetened with sugar alcohols.  Avoid: Fruit juices, caffeinated beverages (coffee, tea, soda), alcohol, sports drinks, or lemon-lime soda. Condiments  Recommended: Ketchup, mustard, horseradish, vinegar, cocoa powder. Spices in moderation: allspice, basil, bay leaves, celery powder or leaves, cinnamon, cumin powder, curry powder, ginger, mace, marjoram, onion or garlic powder, oregano, paprika, parsley flakes, ground pepper, rosemary, sage, savory, tarragon, thyme, turmeric.  Avoid: Coconut, honey. Document Released: 07/21/2003 Document  Revised: 01/23/2012 Document Reviewed: 09/14/2011 [facility] Patient Information 2014 [place], [place].    Viral Gastroenteritis Viral gastroenteritis is also known as stomach flu. This condition affects the stomach and intestinal tract. It can cause sudden diarrhea and vomiting. The illness typically lasts 3 to 8 days. Most people develop an immune response that eventually gets rid of the virus. While this natural response develops, the virus can make you quite ill. CAUSES  Many different viruses can cause gastroenteritis, such as rotavirus or noroviruses. You can catch one of these viruses by consuming contaminated food or water. You may also catch a virus by sharing utensils or other personal items with an infected person or by touching a contaminated surface. SYMPTOMS  The most common symptoms are diarrhea and vomiting. These problems can cause a severe loss of body fluids (dehydration) and a body salt (electrolyte) imbalance. Other symptoms may include:  Fever.  Headache.  Fatigue.  Abdominal pain. DIAGNOSIS  Your caregiver can usually diagnose viral gastroenteritis based on your symptoms and a physical exam. A stool sample may also be taken to test for the presence of viruses or other infections. TREATMENT  This illness typically goes away on its own. Treatments are aimed at rehydration. The most serious cases of viral gastroenteritis involve vomiting so severely that you are not able to keep fluids down. In these cases, fluids must be given through an intravenous line (IV). HOME CARE INSTRUCTIONS   Drink enough fluids to keep your urine clear or pale yellow. Drink small amounts of fluids frequently and increase the amounts as tolerated.  Ask your caregiver for specific rehydration instructions.  Avoid:  Foods high in sugar.  Alcohol.  Carbonated drinks.  Tobacco.  Juice.  Caffeine drinks.  Extremely hot or cold fluids.  Fatty, greasy foods.  Too much intake of  anything at one time.  Dairy products until 24 to 48 hours after diarrhea stops.  You  may consume probiotics. Probiotics are active cultures of beneficial bacteria. They may lessen the amount and number of diarrheal stools in adults. Probiotics can be found in yogurt with active cultures and in supplements.  Wash your hands well to avoid spreading the virus.  Only take over-the-counter or prescription medicines for pain, discomfort, or fever as directed by your caregiver. Do not give aspirin to children. Antidiarrheal medicines are not recommended.  Ask your caregiver if you should continue to take your regular prescribed and over-the-counter medicines.  Keep all follow-up appointments as directed by your caregiver. SEEK IMMEDIATE MEDICAL CARE IF:   You are unable to keep fluids down.  You do not urinate at least once every 6 to 8 hours.  You develop shortness of breath.  You notice blood in your stool or vomit. This may look like coffee grounds.  You have abdominal pain that increases or is concentrated in one small area (localized).  You have persistent vomiting or diarrhea.  You have a fever.  The patient is a child younger than 3 months, and he or she has a fever.  The patient is a child older than 3 months, and he or she has a fever and persistent symptoms.  The patient is a child older than 3 months, and he or she has a fever and symptoms suddenly get worse.  The patient is a baby, and he or she has no tears when crying. MAKE SURE YOU:   Understand these instructions.  Will watch your condition.  Will get help right away if you are not doing well or get worse. Document Released: 04/30/2005 Document Revised: 07/23/2011 Document Reviewed: 02/14/2011 [facility] Patient Information 2014 [place], [place].

## 2013-07-18 NOTE — Progress Notes (Signed)
Pre-visit discussion using our clinic review tool. No additional management support is needed unless otherwise documented below in the visit note.  

## 2013-07-18 NOTE — Assessment & Plan Note (Signed)
Encouraged clear fluids x 24 hours then BRAT diet and advance as tolerated. Given Phenergan to use if vomiting recurs. Seek care if symptoms worsen. Start a probiotic

## 2013-07-18 NOTE — Progress Notes (Signed)
Patient ID: Vincent Diaz, male   DOB: [birth year], 33 y.o.   MRN: [id number] Vincent Diaz [id number] 1980/10/05 07/18/2013      Progress Note-Follow Up  Subjective  Chief Complaint  Chief Complaint  Patient presents with  . Abdominal Pain    Since Thursday morning. C/O sharp pains usually after eating. Pain is a 8/10 during episodes.  Has some nausea & diarhhrea. No vomiting    HPI  33 year old Latin American male who is in today with a 2 day history of abdominal discomfort. He reports several episodes of loose stool each day one episode of blood noted in stool. No fevers chills, malaise or myalgias. He's had some nausea and only infrequent vomiting. He reports no abdominal pain in between meals but then when eating he gets cramping and it follows with nausea and vomiting and sometimes diarrhea. No one else in his family is experiencing similar symptoms. He has not tried altering his diet or any medications thus far  Past Medical History  Diagnosis Date  . History of chicken pox   . Headache   . Premature ejaculation     Dr. Evelene Croon  . Vitamin D deficiency   . Vitamin B12 deficiency   . 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 OSA (obstructive sleep apnea) 2012    mild, AHI 5.51 during total sleep time, 13.87 during REM sleep, rec CPAP (Dr. Mayford Knife)  . ED (erectile dysfunction)     Past Surgical History  Procedure Laterality Date  . Gastric bypass  2010    Roux en Y ([facility])    Family History  Problem Relation Age of Onset  . Hypertension Paternal Grandmother   . Cancer Maternal Aunt     breast  . Drug abuse Mother     h/o  . Cancer Maternal Uncle 56    prostate (age 51-60s)  . Cancer Mother     under evaluation for pancreatic cancer  . Diabetes Neg Hx   . CAD Neg Hx   . Stroke Neg Hx     History   Social History  . Marital Status: Married    Spouse Name: N/A    Number of Children: N/A  . Years of Education: N/A   Occupational  History  . Not on file.   Social History Main Topics  . Smoking status: Never Smoker   . Smokeless tobacco: Never Used  . Alcohol Use: Yes     Comment: seldom  . Drug Use: No  . Sexual Activity: Yes   Other Topics Concern  . Not on file   Social History Narrative   Lives with wife and 2 children (2, 5, 5, 8).   Natasha's husband (of assured tox)   Occupation: System Analyst Market researcher)   Edu: Masters in IT   Activity: coaches youth football (8yo), some working out    Diet: some water, fruits/vegetables daily    Current Outpatient Prescriptions on File Prior to Visit  Medication Sig Dispense Refill  . Cholecalciferol (VITAMIN D3) 2000 UNITS capsule Take 1 capsule (2,000 Units total) by mouth daily.      . cyanocobalamin (,VITAMIN B-12,) 1000 MCG/ML injection Inject 1 mL (1,000 mcg total) into the muscle once. Started 01/2013      . ibuprofen (ADVIL,MOTRIN) 800 MG tablet Take 1 tablet (800 mg total) by mouth 3 (three) times daily.  21 tablet  0  . sertraline (ZOLOFT) 100 MG tablet  Take 100 mg by mouth daily.      . sildenafil (VIAGRA) 100 MG tablet Take 0.5-1 tablets (50-100 mg total) by mouth daily as needed for erectile dysfunction.  10 tablet  1   No current facility-administered medications on file prior to visit.    No Known Allergies  Review of Systems  Review of Systems  Constitutional: Negative for fever, chills and malaise/fatigue.  HENT: Negative for congestion.   Eyes: Negative for discharge.  Respiratory: Negative for shortness of breath.   Cardiovascular: Negative for chest pain, palpitations and leg swelling.  Gastrointestinal: Positive for nausea, vomiting, diarrhea and blood in stool. Negative for abdominal pain, constipation and melena.  Genitourinary: Negative for dysuria.  Musculoskeletal: Negative for falls.  Skin: Negative for rash.  Neurological: Negative for loss of consciousness and headaches.  Endo/Heme/Allergies: Negative for polydipsia.   Psychiatric/Behavioral: Negative for depression and suicidal ideas. The patient is not nervous/anxious and does not have insomnia.     Objective  BP 120/70  Pulse 58  Temp(Src) 98.4 F (36.9 C) (Oral)  Ht 5' 9.5" (1.765 m)  Wt 264 lb 12 oz (120.09 kg)  BMI 38.55 kg/m2  SpO2 98%  Physical Exam  Physical Exam  Constitutional: He is oriented to person, place, and time and well-developed, well-nourished, and in no distress. No distress.  HENT:  Head: Normocephalic and atraumatic.  Eyes: Conjunctivae are normal.  Neck: Neck supple. No thyromegaly present.  Cardiovascular: Normal rate, regular rhythm and normal heart sounds.   No murmur heard. Pulmonary/Chest: Effort normal and breath sounds normal. No respiratory distress.  Abdominal: He exhibits no distension and no mass. There is no tenderness.  Musculoskeletal: He exhibits no edema.  Neurological: He is alert and oriented to person, place, and time.  Skin: Skin is warm.  Psychiatric: Memory, affect and judgment normal.    Lab Results  Component Value Date   TSH 2.02 04/27/2013   Lab Results  Component Value Date   WBC 7.5 04/21/2013   HGB 12.8* 04/21/2013   HCT 38.0* 04/21/2013   MCV 83.0 04/21/2013   PLT 253 04/21/2013   Lab Results  Component Value Date   CREATININE 0.99 04/21/2013   BUN 15 04/21/2013   NA 138 04/21/2013   K 3.9 04/21/2013   CL 104 04/21/2013   CO2 24 04/21/2013   Lab Results  Component Value Date   ALT 14 04/21/2013   AST 19 04/21/2013   ALKPHOS 87 04/21/2013   BILITOT 0.4 04/21/2013   Lab Results  Component Value Date   CHOL 134 04/27/2013   Lab Results  Component Value Date   HDL 42.60 04/27/2013   Lab Results  Component Value Date   LDLCALC 86 04/27/2013   Lab Results  Component Value Date   TRIG 25.0 04/27/2013   Lab Results  Component Value Date   CHOLHDL 3 04/27/2013     Assessment & Plan  Gastroenteritis Encouraged clear fluids x 24 hours then BRAT diet and advance as  tolerated. Given Phenergan to use if vomiting recurs. Seek care if symptoms worsen. Start a probiotic

## 2013-07-29 ENCOUNTER — Ambulatory Visit (INDEPENDENT_AMBULATORY_CARE_PROVIDER_SITE_OTHER): Payer: BC Managed Care – PPO | Admitting: *Deleted

## 2013-07-29 DIAGNOSIS — E538 Deficiency of other specified B group vitamins: Secondary | ICD-10-CM

## 2013-07-29 MED ORDER — CYANOCOBALAMIN 1000 MCG/ML IJ SOLN
1000.0000 ug | Freq: Once | INTRAMUSCULAR | Status: AC
  Administered 2013-07-29: 1000 ug via INTRAMUSCULAR

## 2013-09-02 ENCOUNTER — Ambulatory Visit (INDEPENDENT_AMBULATORY_CARE_PROVIDER_SITE_OTHER): Payer: BC Managed Care – PPO | Admitting: *Deleted

## 2013-09-02 DIAGNOSIS — E538 Deficiency of other specified B group vitamins: Secondary | ICD-10-CM

## 2013-09-02 MED ORDER — CYANOCOBALAMIN 1000 MCG/ML IJ SOLN
1000.0000 ug | Freq: Once | INTRAMUSCULAR | Status: AC
  Administered 2013-09-02: 1000 ug via INTRAMUSCULAR

## 2013-09-29 ENCOUNTER — Telehealth (HOSPITAL_COMMUNITY): Payer: Self-pay

## 2013-09-29 NOTE — Telephone Encounter (Signed)
This patient is overdue for recommended [facility]. Call attempted today to reestablish post-op care with [facility].  Patient advised that he was told [facility] would be contacting him when it was time for him to be seen again and he has never received a call from anyone. He was agreeable to making an appointment today. Tried to transfer the call directly to [place] at [facility], but call went to voicemail. Provided patient with contact information for [facility] and transferred him to [employer]anca's voicemail. Patient was going to leave a message for her asking for a post-op consult with Dr. Johna SheriffHoxworth. Routing message to [place] so she can follow-up if patient failed to leave message or call didn't transfer correctly.   Jim LikeAmanda T. [facility] Coordinator [phone]

## 2013-10-06 ENCOUNTER — Ambulatory Visit (INDEPENDENT_AMBULATORY_CARE_PROVIDER_SITE_OTHER): Payer: BC Managed Care – PPO | Admitting: *Deleted

## 2013-10-06 DIAGNOSIS — E538 Deficiency of other specified B group vitamins: Secondary | ICD-10-CM

## 2013-10-06 MED ORDER — CYANOCOBALAMIN 1000 MCG/ML IJ SOLN
1000.0000 ug | Freq: Once | INTRAMUSCULAR | Status: AC
  Administered 2013-10-06: 1000 ug via INTRAMUSCULAR

## 2013-11-05 ENCOUNTER — Ambulatory Visit (INDEPENDENT_AMBULATORY_CARE_PROVIDER_SITE_OTHER): Payer: BC Managed Care – PPO | Admitting: *Deleted

## 2013-11-05 DIAGNOSIS — E538 Deficiency of other specified B group vitamins: Secondary | ICD-10-CM

## 2013-11-05 MED ORDER — CYANOCOBALAMIN 1000 MCG/ML IJ SOLN
1000.0000 ug | Freq: Once | INTRAMUSCULAR | Status: AC
  Administered 2013-11-05: 1000 ug via INTRAMUSCULAR

## 2013-12-09 ENCOUNTER — Ambulatory Visit (INDEPENDENT_AMBULATORY_CARE_PROVIDER_SITE_OTHER): Payer: BC Managed Care – PPO | Admitting: *Deleted

## 2013-12-09 DIAGNOSIS — E538 Deficiency of other specified B group vitamins: Secondary | ICD-10-CM

## 2013-12-09 MED ORDER — CYANOCOBALAMIN 1000 MCG/ML IJ SOLN
1000.0000 ug | Freq: Once | INTRAMUSCULAR | Status: AC
  Administered 2013-12-09: 1000 ug via INTRAMUSCULAR

## 2014-01-12 ENCOUNTER — Ambulatory Visit (INDEPENDENT_AMBULATORY_CARE_PROVIDER_SITE_OTHER): Payer: BC Managed Care – PPO | Admitting: *Deleted

## 2014-01-12 DIAGNOSIS — E538 Deficiency of other specified B group vitamins: Secondary | ICD-10-CM

## 2014-01-12 MED ORDER — CYANOCOBALAMIN 1000 MCG/ML IJ SOLN
1000.0000 ug | Freq: Once | INTRAMUSCULAR | Status: AC
  Administered 2014-01-12: 1000 ug via INTRAMUSCULAR

## 2014-02-12 ENCOUNTER — Ambulatory Visit: Payer: BC Managed Care – PPO

## 2014-02-16 ENCOUNTER — Ambulatory Visit (INDEPENDENT_AMBULATORY_CARE_PROVIDER_SITE_OTHER): Payer: BC Managed Care – PPO | Admitting: *Deleted

## 2014-02-16 DIAGNOSIS — E538 Deficiency of other specified B group vitamins: Secondary | ICD-10-CM

## 2014-02-16 DIAGNOSIS — Z23 Encounter for immunization: Secondary | ICD-10-CM

## 2014-02-16 MED ORDER — CYANOCOBALAMIN 1000 MCG/ML IJ SOLN
1000.0000 ug | Freq: Once | INTRAMUSCULAR | Status: AC
  Administered 2014-02-16: 1000 ug via INTRAMUSCULAR

## 2014-03-23 ENCOUNTER — Ambulatory Visit (INDEPENDENT_AMBULATORY_CARE_PROVIDER_SITE_OTHER): Payer: BC Managed Care – PPO | Admitting: *Deleted

## 2014-03-23 DIAGNOSIS — E538 Deficiency of other specified B group vitamins: Secondary | ICD-10-CM

## 2014-03-23 MED ORDER — CYANOCOBALAMIN 1000 MCG/ML IJ SOLN
1000.0000 ug | Freq: Once | INTRAMUSCULAR | Status: AC
  Administered 2014-03-23: 1000 ug via INTRAMUSCULAR

## 2014-05-04 ENCOUNTER — Ambulatory Visit: Payer: BC Managed Care – PPO

## 2014-10-23 ENCOUNTER — Observation Stay (HOSPITAL_COMMUNITY): Payer: Medicaid Other

## 2014-10-23 ENCOUNTER — Observation Stay (HOSPITAL_COMMUNITY)
Admission: EM | Admit: 2014-10-23 | Discharge: 2014-10-24 | Disposition: A | Discharge status: Home / Self Care | Payer: Medicaid Other | Attending: Internal Medicine | Admitting: Internal Medicine

## 2014-10-23 ENCOUNTER — Encounter (HOSPITAL_COMMUNITY): Payer: Self-pay | Admitting: Emergency Medicine

## 2014-10-23 ENCOUNTER — Emergency Department (HOSPITAL_COMMUNITY): Payer: Medicaid Other

## 2014-10-23 DIAGNOSIS — Z9884 Bariatric surgery status: Secondary | ICD-10-CM

## 2014-10-23 DIAGNOSIS — R55 Syncope and collapse: Principal | ICD-10-CM | POA: Diagnosis present

## 2014-10-23 DIAGNOSIS — N529 Male erectile dysfunction, unspecified: Secondary | ICD-10-CM | POA: Diagnosis not present

## 2014-10-23 DIAGNOSIS — G4733 Obstructive sleep apnea (adult) (pediatric): Secondary | ICD-10-CM | POA: Diagnosis present

## 2014-10-23 DIAGNOSIS — E538 Deficiency of other specified B group vitamins: Secondary | ICD-10-CM | POA: Diagnosis not present

## 2014-10-23 DIAGNOSIS — R519 Headache, unspecified: Secondary | ICD-10-CM

## 2014-10-23 DIAGNOSIS — E559 Vitamin D deficiency, unspecified: Secondary | ICD-10-CM | POA: Diagnosis not present

## 2014-10-23 DIAGNOSIS — R079 Chest pain, unspecified: Secondary | ICD-10-CM | POA: Diagnosis not present

## 2014-10-23 DIAGNOSIS — R000 Tachycardia, unspecified: Secondary | ICD-10-CM | POA: Diagnosis not present

## 2014-10-23 DIAGNOSIS — R4182 Altered mental status, unspecified: Secondary | ICD-10-CM

## 2014-10-23 DIAGNOSIS — R51 Headache: Secondary | ICD-10-CM

## 2014-10-23 DIAGNOSIS — R531 Weakness: Secondary | ICD-10-CM | POA: Diagnosis not present

## 2014-10-23 LAB — BASIC METABOLIC PANEL
ANION GAP: 7 (ref 5–15)
BUN: 16 mg/dL (ref 6–20)
CHLORIDE: 112 mmol/L — AB (ref 101–111)
CO2: 22 mmol/L (ref 22–32)
CREATININE: 1.13 mg/dL (ref 0.61–1.24)
Calcium: 8.4 mg/dL — ABNORMAL LOW (ref 8.9–10.3)
GFR calc Af Amer: >60 mL/min (ref >60)
GLUCOSE: 74 mg/dL (ref 65–99)
POTASSIUM: 3.5 mmol/L (ref 3.5–5.1)
Sodium: 141 mmol/L (ref 135–145)

## 2014-10-23 LAB — CBC
HCT: 37.5 % — ABNORMAL LOW (ref 39.0–52.0)
HEMOGLOBIN: 12.1 g/dL — AB (ref 13.0–17.0)
MCH: 26.4 pg (ref 26.0–34.0)
MCHC: 32.3 g/dL (ref 30.0–36.0)
MCV: 81.9 fL (ref 78.0–100.0)
Platelets: 239 10*3/uL (ref 150–400)
RBC: 4.58 MIL/uL (ref 4.22–5.81)
RDW: 14.3 % (ref 11.5–15.5)
WBC: 7.2 10*3/uL (ref 4.0–10.5)

## 2014-10-23 LAB — I-STAT CHEM 8, ED
BUN: 17 mg/dL (ref 6–20)
CALCIUM ION: 1.21 mmol/L (ref 1.12–1.23)
Chloride: 111 mmol/L (ref 101–111)
Creatinine, Ser: 1.1 mg/dL (ref 0.61–1.24)
Glucose, Bld: 70 mg/dL (ref 65–99)
HCT: 38 % — ABNORMAL LOW (ref 39.0–52.0)
Hemoglobin: 12.9 g/dL — ABNORMAL LOW (ref 13.0–17.0)
Potassium: 3.5 mmol/L (ref 3.5–5.1)
Sodium: 145 mmol/L (ref 135–145)
TCO2: 19 mmol/L (ref 0–100)

## 2014-10-23 LAB — I-STAT TROPONIN, ED: Troponin i, poc: 0 ng/mL (ref 0.00–0.08)

## 2014-10-23 LAB — ETHANOL: Alcohol, Ethyl (B): <5 mg/dL (ref <5)

## 2014-10-23 MED ORDER — ASPIRIN 325 MG PO TABS: 325.0000 mg | ORAL_TABLET | ORAL | Status: DC

## 2014-10-23 MED ORDER — SODIUM CHLORIDE 0.9 % IV SOLN
Freq: Once | INTRAVENOUS | Status: AC
  Administered 2014-10-23: 20:00:00 via INTRAVENOUS

## 2014-10-23 MED ORDER — SODIUM CHLORIDE 0.9 % IV BOLUS (SEPSIS)
1000.0000 mL | Freq: Once | INTRAVENOUS | Status: AC
  Administered 2014-10-23: 1000 mL via INTRAVENOUS

## 2014-10-23 MED ORDER — ASPIRIN 81 MG PO CHEW
324.0000 mg | CHEWABLE_TABLET | Freq: Once | ORAL | Status: AC
  Administered 2014-10-23: 324 mg via ORAL
  Filled 2014-10-23: qty 4

## 2014-10-23 MED ORDER — ASPIRIN 81 MG PO CHEW: 81.0000 mg | CHEWABLE_TABLET | Freq: Once | ORAL | Status: DC

## 2014-10-23 MED ORDER — NITROGLYCERIN 0.4 MG SL SUBL
0.4000 mg | SUBLINGUAL_TABLET | SUBLINGUAL | Status: DC | PRN
  Administered 2014-10-23: 0.4 mg via SUBLINGUAL
  Filled 2014-10-23: qty 1

## 2014-10-23 NOTE — ED Notes (Signed)
Pt reports cooking outside when sudden onset of palpitations, cp and syncope occurred; pt reports feeling weak and tired; pt reports cp across entire chest and radiates to back; pt denies sob, n/v, diaphoresis; pt reports same has happened in the past

## 2014-10-23 NOTE — Progress Notes (Signed)
Called ED for report  

## 2014-10-23 NOTE — ED Provider Notes (Signed)
CSN: [id number]     Arrival date & time 10/23/14  1911 History   First MD Initiated Contact with Patient 10/23/14 1921     Chief Complaint  Patient presents with  . Loss of Consciousness  . Chest Pain     (Consider location/radiation/quality/duration/timing/severity/associated sxs/prior Treatment) HPI Comments: The patient is a 34 year old male, he has a history of gastric bypass in 2010, obstructive sleep apnea diagnosed in 2012, he takes medications such as Phenergan as needed, Zoloft and Viagra. The patient reports that while he was outside cooking he developed chest pain, it has been intermittent today, all of a sudden the patient became dizzy and the report from family members was that he passed out. The patient states that he has a headache at this time, it has started since the ambulance picked him up, he denies having chest pain at this time. It is very difficult to get any information out of the patient as he speaks very quietly and very slowly, he was unable to ambulate after this occurred. HEENT the wife reports that when he became lightheaded then brought him into the house and sat him down at the table at which time he syncopized, he was slumped over forwards at the table, unable to lift his head, was unresponsive.  The significant other reports that there has been no recent coughing, fever, diarrhea, dysuria, rashes, swelling or any other complaints and states that he had been standing at the grill cooking without any difficulty prior to this occurring.  Patient is a 34 y.o. male presenting with syncope and chest pain. The history is provided by the patient and the spouse.  Loss of Consciousness Associated symptoms: chest pain   Chest Pain Associated symptoms: syncope     Past Medical History  Diagnosis Date  . History of chicken pox   . Headache   . Premature ejaculation     Dr. Evelene Croon  . Vitamin D deficiency   . Vitamin B12 deficiency   . 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 OSA (obstructive sleep apnea) 2012    mild, AHI 5.51 during total sleep time, 13.87 during REM sleep, rec CPAP (Dr. Mayford Knife)  . ED (erectile dysfunction)    Past Surgical History  Procedure Laterality Date  . Gastric bypass  2010    Roux en Y ([facility])   Family History  Problem Relation Age of Onset  . Hypertension Paternal Grandmother   . Cancer Maternal Aunt     breast  . Drug abuse Mother     h/o  . Cancer Maternal Uncle 57    prostate (age 8-60s)  . Cancer Mother     under evaluation for pancreatic cancer  . Diabetes Neg Hx   . CAD Neg Hx   . Stroke Neg Hx    History  Substance Use Topics  . Smoking status: Never Smoker   . Smokeless tobacco: Never Used  . Alcohol Use: Yes     Comment: seldom    Review of Systems  Cardiovascular: Positive for chest pain and syncope.  All other systems reviewed and are negative.     Allergies  Review of patient's allergies indicates no known allergies.  Home Medications   Prior to Admission medications   Medication Sig Start Date End Date Taking? Authorizing Provider  ibuprofen (ADVIL,MOTRIN) 200 MG tablet Take 200 mg by mouth every 6 (six) hours as needed for headache (pain).   Yes  Historical Provider, MD  Cholecalciferol (VITAMIN D3) 2000 UNITS capsule Take 1 capsule (2,000 Units total) by mouth daily. Patient not taking: Reported on 10/23/2014 04/27/13   [name], MD  ibuprofen (ADVIL,MOTRIN) 800 MG tablet Take 1 tablet (800 mg total) by mouth 3 (three) times daily. Patient not taking: Reported on 10/23/2014 04/21/13   Sunnie Nielsen, MD  promethazine (PHENERGAN) 25 MG tablet Take 1 tablet (25 mg total) by mouth every 8 (eight) hours as needed for nausea or vomiting. Patient not taking: Reported on 10/23/2014 07/18/13   Bradd Canary, MD  sildenafil (VIAGRA) 100 MG tablet Take 0.5-1 tablets (50-100 mg total) by mouth daily as needed for erectile dysfunction. Patient not taking: Reported on  10/23/2014 04/27/13   [name], MD   BP 140/89 mmHg  Pulse 63  Temp(Src) 98.8 F (37.1 C) (Oral)  Resp 20  Ht  (1.778 m)  Wt 260 lb (117.935 kg)  BMI 37.31 kg/m2  SpO2 100% Physical Exam  Constitutional: He appears well-developed and well-nourished. No distress.  HENT:  Head: Normocephalic and atraumatic.  Mouth/Throat: Oropharynx is clear and moist. No oropharyngeal exudate.  Eyes: Conjunctivae and EOM are normal. Pupils are equal, round, and reactive to light. Right eye exhibits no discharge. Left eye exhibits no discharge. No scleral icterus.  Neck: Normal range of motion. Neck supple. No JVD present. No thyromegaly present.  Cardiovascular: Normal rate, regular rhythm, normal heart sounds and intact distal pulses.  Exam reveals no gallop and no friction rub.   No murmur heard. Pulmonary/Chest: Effort normal and breath sounds normal. No respiratory distress. He has no wheezes. He has no rales.  Abdominal: Soft. Bowel sounds are normal. He exhibits no distension and no mass. There is no tenderness.  Musculoskeletal: Normal range of motion. He exhibits no edema or tenderness.  Soft compartments, supple joints, no deformities of the extremities, no tenderness over the trunk  Lymphadenopathy:    He has no cervical adenopathy.  Neurological: He is alert. Coordination normal.  Severe generalized weakness. Difficult to lift either arm or either leg, cannot lift his legs off the bed against gravity, can barely lift her arms against gravity, has extremely weak grips, speech is quiet but clear and well organized, no obvious cranial nerve deficits, able to open eyes barely, normal extraocular movements.  Skin: Skin is warm and dry. No rash noted. No erythema.  Psychiatric: He has a normal mood and affect. His behavior is normal.  Nursing note and vitals reviewed.   ED Course  Procedures (including critical care time) Labs Review Labs Reviewed  CBC - Abnormal; Notable for the  following:    Hemoglobin 12.1 (*)    HCT 37.5 (*)    All other components within normal limits  BASIC METABOLIC PANEL - Abnormal; Notable for the following:    Chloride 112 (*)    Calcium 8.4 (*)    All other components within normal limits  I-STAT CHEM 8, ED - Abnormal; Notable for the following:    Hemoglobin 12.9 (*)    HCT 38.0 (*)    All other components within normal limits  I-STAT TROPOININ, ED  I-STAT TROPOININ, ED    Imaging Review Ct Head Wo Contrast  10/23/2014   CLINICAL DATA:  Syncopal episode while cooking outside.  EXAM: CT HEAD WITHOUT CONTRAST  TECHNIQUE: Contiguous axial images were obtained from the base of the skull through the vertex without intravenous contrast.  COMPARISON:  None.  FINDINGS: The brain has a  normal appearance without evidence of atrophy, infarction, mass lesion, hemorrhage, hydrocephalus or extra-axial collection. The calvarium is unremarkable. The paranasal sinuses, middle ears and mastoids are clear.  IMPRESSION: Normal head CT   Electronically Signed   By: [name] M.D.   On: 10/23/2014 20:25   Dg Chest Port 1 View  10/23/2014   CLINICAL DATA:  Acute chest pain and palpitations.  EXAM: PORTABLE CHEST - 1 VIEW  COMPARISON:  04/21/2017 and 12/09/2008  FINDINGS: The cardiomediastinal silhouette is unchanged.  Elevation of the right hemidiaphragm again noted in this low volume film.  There may be mild pulmonary vascular congestion present.  There is no evidence of focal airspace disease, pulmonary edema, suspicious pulmonary nodule/mass, pleural effusion, or pneumothorax. No acute bony abnormalities are identified.  IMPRESSION: Low volume film with possible mild pulmonary vascular congestion.   Electronically Signed   By: Harmon Pier M.D.   On: 10/23/2014 20:00     EKG Interpretation   Date/Time:  Saturday October 23 2014 19:18:12 EDT Ventricular Rate:  59 PR Interval:  174 QRS Duration: 103 QT Interval:  403 QTC Calculation: 399 R Axis:    68 Text Interpretation:  Sinus arrhythmia Non-specific intra-ventricular  conduction delay ECG OTHERWISE WITHIN NORMAL LIMITS since last tracing no  significant change Confirmed by Morine Kohlman  MD, Jerney Baksh ([id number]) on 10/23/2014  7:52:45 PM      MDM   Final diagnoses:  Headache  Altered mental status, unspecified altered mental status type  Syncope, unspecified syncope type    The patient has a very abnormal exam, neurologically he is not doing very much at all. He does appear awake, he has a preserved mental status, it is difficult to arouse him, the family member denies any alcohol or drug use, there was no head injury, with his current headache would be concerned for subarachnoid hemorrhage, potentially hypokalemia, heat exhaustion, dehydration, labs and imaging pending.  D/w Dr. Cyril Mourning at 9:22, he will see the pt in consultation in the ED  Labs, ECG, CT head and CXR without acute findings.  The patient has a continued altered mental status, he has been seen by neurology, they agree that this is likely to be psychiatric in nature but because the patient is still some flaccid and unable to follow commands with normal strength in his lower extremities that he should be admitted for observation overnight and receive an MRI. Discussed with the family, discussed with the neurologist who has seen the patient in the emergency department, discussed with the admitting physician Dr. Onalee Hua who agrees for telemetry observation.    Eber Hong, MD 10/23/14 [phone]

## 2014-10-23 NOTE — Consult Note (Addendum)
NEURO HOSPITALIST CONSULT NOTE   Referring physician: Dr Sabra Heck Reason for Consult: generalized weakness, decreased responsiveness  HPI:                                                                                                                                          Vincent Diaz is an 34 y.o. male with a past medical history significant for gastric bypass surgery, vitamin D and B12 deficiency, OSA, ED, previous visits to the ED with chest pain/palpitations, brought in for evaluation of the aforementioned symptoms. Patient's family is at the bedside.  He said that he was outside grilling in a birthday party when suddenly felt very hot, had chest pain, his heart started raising, and became dizzy. His wife said that he was able to walk into the kitchen, sat down, arm became limp, and slumped over. Contrary to what family expressed before, wife said that he was aware and able to speak during the episode. No bladder or bowel incontinence, tongue biting, slurred speech, confusion, numbness, language or visual impairment. Denies use of new medications, alcohol intake, or use of illicit drugs. No recent fever, chills, or medical illnesses. No head or neck trauma. Of note, while in he ED patient stated to his family that he saw her stillborn daughter in the room. CT brain was personally reviewed and showed no acute abnormality. Serologies are unrevealing.  Past Medical History  Diagnosis Date  . History of chicken pox   . Headache   . Premature ejaculation     Dr. Yves Dill  . Vitamin D deficiency   . Vitamin B12 deficiency   . 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 OSA (obstructive sleep apnea) 2012    mild, AHI 5.51 during total sleep time, 13.87 during REM sleep, rec CPAP (Dr. Radford Pax)  . ED (erectile dysfunction)     Past Surgical History  Procedure Laterality Date  . Gastric bypass  2010    Roux en Y ([facility])    Family History   Problem Relation Age of Onset  . Hypertension Paternal Grandmother   . Cancer Maternal Aunt     breast  . Drug abuse Mother     h/o  . Cancer Maternal Uncle 70    prostate (age 44-60s)  . Cancer Mother     under evaluation for pancreatic cancer  . Diabetes Neg Hx   . CAD Neg Hx   . Stroke Neg Hx     Family History: no epilepsy, brain tumors, or brain aneurysms.   Social History:  reports that he has never smoked. He has never used smokeless tobacco. He reports that he drinks alcohol. He reports that he does not use illicit drugs.  No Known  Allergies  MEDICATIONS:                                                                                                                     I have reviewed the patient's current medications.   ROS:                                                                                                                                       History obtained from chart review and the patient, and family  General ROS: negative for - chills, fatigue, fever, night sweats, or weight loss Psychological ROS: negative for - behavioral disorder, memory difficulties, mood swings or suicidal ideation Ophthalmic ROS: negative for - blurry vision, double vision, eye pain or loss of vision ENT ROS: negative for - epistaxis, nasal discharge, oral lesions, sore throat, tinnitus or vertigo Allergy and Immunology ROS: negative for - hives or itchy/watery eyes Hematological and Lymphatic ROS: negative for - bleeding problems, bruising or swollen lymph nodes Endocrine ROS: negative for - galactorrhea, hair pattern changes, polydipsia/polyuria or temperature intolerance Respiratory ROS: negative for - cough, hemoptysis, shortness of breath or wheezing Cardiovascular ROS: negative for - dyspnea on exertion or edema Gastrointestinal ROS: negative for - abdominal pain, diarrhea, hematemesis, nausea/vomiting or stool incontinence Genito-Urinary ROS: negative for - dysuria,  hematuria, incontinence or urinary frequency/urgency Musculoskeletal ROS: negative for - joint swelling Neurological ROS: as noted in HPI Dermatological ROS: negative for rash and skin lesion changes   Physical exam: pleasant male in no apparent distress. Blood pressure 140/89, pulse 63, temperature 98.8 F (37.1 C), temperature source Oral, resp. rate 20, height 5' 10"  (1.778 m), weight 117.935 kg (260 lb), SpO2 100 %. Head: normocephalic. Neck: supple, no bruits, no JVD. Cardiac: no murmurs. Lungs: clear. Abdomen: soft, no tender, no mass. Extremities: no edema. Skin: no rash  Neurologic Examination:  General: Mental Status: Alert, oriented, thought content appropriate.  Speech fluent without evidence of aphasia.  Able to follow 3 step commands without difficulty. Cranial Nerves: II: Discs flat bilaterally; Visual fields grossly normal, pupils equal, round, reactive to light and accommodation III,IV, VI: ptosis not present, extra-ocular motions intact bilaterally V,VII: smile symmetric, facial light touch sensation normal bilaterally VIII: hearing normal bilaterally IX,X: uvula rises symmetrically XI: bilateral shoulder shrug XII: midline tongue extension without atrophy or fasciculations Motor: Patient with minimal motor movements upper and lower extremities, but poor effort and some inconsistencies on testing. Tone and bulk:normal tone throughout; no atrophy noted Sensory: Pinprick and light touch intact throughout, bilaterally Deep Tendon Reflexes:  1+ all over Plantars: Right: downgoing   Left: downgoing Cerebellar: normal finger-to-nose,  normal heel-to-shin test Gait:  Unable to test, patient can not walk    Lab Results  Component Value Date/Time   CHOL 134 04/27/2013 12:26 PM    Results for orders placed or performed during the hospital encounter of 10/23/14  (from the past 48 hour(s))  CBC     Status: Abnormal   Collection Time: 10/23/14  7:33 PM  Result Value Ref Range   WBC 7.2 4.0 - 10.5 K/uL   RBC 4.58 4.22 - 5.81 MIL/uL   Hemoglobin 12.1 (L) 13.0 - 17.0 g/dL   HCT 37.5 (L) 39.0 - 52.0 %   MCV 81.9 78.0 - 100.0 fL   MCH 26.4 26.0 - 34.0 pg   MCHC 32.3 30.0 - 36.0 g/dL   RDW 14.3 11.5 - 15.5 %   Platelets 239 150 - 400 K/uL  Basic metabolic panel     Status: Abnormal   Collection Time: 10/23/14  7:33 PM  Result Value Ref Range   Sodium 141 135 - 145 mmol/L   Potassium 3.5 3.5 - 5.1 mmol/L   Chloride 112 (H) 101 - 111 mmol/L   CO2 22 22 - 32 mmol/L   Glucose, Bld 74 65 - 99 mg/dL   BUN 16 6 - 20 mg/dL   Creatinine, Ser 1.13 0.61 - 1.24 mg/dL   Calcium 8.4 (L) 8.9 - 10.3 mg/dL   GFR calc non Af Amer >60 >60 mL/min   GFR calc Af Amer >60 >60 mL/min    Comment: (NOTE) The eGFR has been calculated using the CKD EPI equation. This calculation has not been validated in all clinical situations. eGFR's persistently <60 mL/min signify possible Chronic Kidney Disease.    Anion gap 7 5 - 15  I-stat troponin, ED  (not at [facility], [facility])     Status: None   Collection Time: 10/23/14  7:37 PM  Result Value Ref Range   Troponin i, poc 0.00 0.00 - 0.08 ng/mL   Comment 3            Comment: Due to the release kinetics of cTnI, a negative result within the first hours of the onset of symptoms does not rule out myocardial infarction with certainty. If myocardial infarction is still suspected, repeat the test at appropriate intervals.   I-stat chem 8, ed     Status: Abnormal   Collection Time: 10/23/14  7:51 PM  Result Value Ref Range   Sodium 145 135 - 145 mmol/L   Potassium 3.5 3.5 - 5.1 mmol/L   Chloride 111 101 - 111 mmol/L   BUN 17 6 - 20 mg/dL   Creatinine, Ser 1.10 0.61 - 1.24 mg/dL   Glucose, Bld 70 65 - 99 mg/dL   Calcium,  Ion 1.21 1.12 - 1.23 mmol/L   TCO2 19 0 - 100 mmol/L   Hemoglobin 12.9 (L) 13.0 - 17.0 g/dL   HCT 38.0 (L)  39.0 - 52.0 %    Ct Head Wo Contrast  10/23/2014   CLINICAL DATA:  Syncopal episode while cooking outside.  EXAM: CT HEAD WITHOUT CONTRAST  TECHNIQUE: Contiguous axial images were obtained from the base of the skull through the vertex without intravenous contrast.  COMPARISON:  None.  FINDINGS: The brain has a normal appearance without evidence of atrophy, infarction, mass lesion, hemorrhage, hydrocephalus or extra-axial collection. The calvarium is unremarkable. The paranasal sinuses, middle ears and mastoids are clear.  IMPRESSION: Normal head CT   Electronically Signed   By: Nelson Chimes M.D.   On: 10/23/2014 20:25   Dg Chest Port 1 View  10/23/2014   CLINICAL DATA:  Acute chest pain and palpitations.  EXAM: PORTABLE CHEST - 1 VIEW  COMPARISON:  04/21/2017 and 12/09/2008  FINDINGS: The cardiomediastinal silhouette is unchanged.  Elevation of the right hemidiaphragm again noted in this low volume film.  There may be mild pulmonary vascular congestion present.  There is no evidence of focal airspace disease, pulmonary edema, suspicious pulmonary nodule/mass, pleural effusion, or pneumothorax. No acute bony abnormalities are identified.  IMPRESSION: Low volume film with possible mild pulmonary vascular congestion.   Electronically Signed   By: Margarette Canada M.D.   On: 10/23/2014 20:00   Assessment/Plan: 34 y/o brought in after developing sudden onset chest pain, rasing heart beat, dizziness, and generalized weakness. Conflicting report by family about whether or not there was decreased responsiveness. CT brain without acute abnormality. Family informed that he " saw his stillborn child in the ED room tonight". His neuro-exam demonstrated obvious inconsistencies and I am afraid his arms and legs weakness may not reflect structural brain or cord disease. He said that he won't be able to walk and thus agree with admission to medicine and obtaining MRI brain and cervical spine while in the  hospital. PT. Will follow up.    Dorian Pod, MD 10/23/2014, 9:57 PM   Addendum: MRI brain and cervical reviewed and both are unremarkable. Functional weakness arms and legs. Psych consult recommended. Neurology will sign off.  Dorian Pod, MD

## 2014-10-23 NOTE — H&P (Signed)
PCP:   [name], MD   Chief Complaint:  Passed out, chest pain  HPI: 34 yo male was at a grill party when he started to have headache, sscp, dizziness and passed out.  Since arrival pt has had AMS, slow to respond.  Moves all extremities but is slow to respond.  Nursing staff has witnessed him move himself from bed to bed and roll over in bed.  Pt shows very poor effort with moving anything on command.  Wife is present and did not witness event tonight.  Pt does not recall much about event.  Denies any h/o seizures.  No recent fevers, n/v/d.  No cough no rashes.  Review of Systems:  Positive and negative as per HPI otherwise all other systems are negative  Past Medical History: Past Medical History  Diagnosis Date  . History of chicken pox   . Headache   . Premature ejaculation     Dr. Evelene Croon  . Vitamin D deficiency   . Vitamin B12 deficiency   . 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 OSA (obstructive sleep apnea) 2012    mild, AHI 5.51 during total sleep time, 13.87 during REM sleep, rec CPAP (Dr. Mayford Knife)  . ED (erectile dysfunction)    Past Surgical History  Procedure Laterality Date  . Gastric bypass  2010    Roux en Y ([place])    Medications: Prior to Admission medications   Medication Sig Start Date End Date Taking? Authorizing Provider  ibuprofen (ADVIL,MOTRIN) 200 MG tablet Take 200 mg by mouth every 6 (six) hours as needed for headache (pain).   Yes Historical Provider, MD  Cholecalciferol (VITAMIN D3) 2000 UNITS capsule Take 1 capsule (2,000 Units total) by mouth daily. Patient not taking: Reported on 10/23/2014 04/27/13   [name], MD  ibuprofen (ADVIL,MOTRIN) 800 MG tablet Take 1 tablet (800 mg total) by mouth 3 (three) times daily. Patient not taking: Reported on 10/23/2014 04/21/13   Sunnie Nielsen, MD  promethazine (PHENERGAN) 25 MG tablet Take 1 tablet (25 mg total) by mouth every 8 (eight) hours as needed for nausea or  vomiting. Patient not taking: Reported on 10/23/2014 07/18/13   Bradd Canary, MD  sildenafil (VIAGRA) 100 MG tablet Take 0.5-1 tablets (50-100 mg total) by mouth daily as needed for erectile dysfunction. Patient not taking: Reported on 10/23/2014 04/27/13   [name], MD    Allergies:  No Known Allergies  Social History:  reports that he has never smoked. He has never used smokeless tobacco. He reports that he drinks alcohol. He reports that he does not use illicit drugs.  Family History: Family History  Problem Relation Age of Onset  . Hypertension Paternal Grandmother   . Cancer Maternal Aunt     breast  . Drug abuse Mother     h/o  . Cancer Maternal Uncle 34    prostate (age 21-60s)  . Cancer Mother     under evaluation for pancreatic cancer  . Diabetes Neg Hx   . CAD Neg Hx   . Stroke Neg Hx     Physical Exam: Filed Vitals:   10/23/14 2200 10/23/14 2215 10/23/14 2230 10/23/14 2245  BP: 130/71 122/76 128/77 124/75  Pulse: 57 61 59 75  Temp:      TempSrc:      Resp: Height:      Weight:      SpO2: 100%  100% 100% 100%   General appearance: alert, cooperative and no distress Head: Normocephalic, without obvious abnormality, atraumatic Eyes: negative Nose: Nares normal. Septum midline. Mucosa normal. No drainage or sinus tenderness. Neck: no JVD and supple, symmetrical, trachea midline Lungs: clear to auscultation bilaterally Heart: regular rate and rhythm, S1, S2 normal, no murmur, click, rub or gallop Abdomen: soft, non-tender; bowel sounds normal; no masses,  no organomegaly Extremities: extremities normal, atraumatic, no cyanosis or edema Pulses: 2+ and symmetric Skin: Skin color, texture, turgor normal. No rashes or lesions Neurologic: Mental status: Alert, oriented, thought content appropriate Cranial nerves: normal strength diffuse 2/5 very poor effort    Labs on Admission:   Recent Labs  10/23/14 1933 10/23/14 1951  NA 141 145   K 3.5 3.5  CL 112* 111  CO2 22  --   GLUCOSE 74 70  BUN 16 17  CREATININE 1.13 1.10  CALCIUM 8.4*  --     Recent Labs  10/23/14 1933 10/23/14 1951  WBC 7.2  --   HGB 12.1* 12.9*  HCT 37.5* 38.0*  MCV 81.9  --   PLT 239  --     Radiological Exams on Admission: Ct Head Wo Contrast  10/23/2014   CLINICAL DATA:  Syncopal episode while cooking outside.  EXAM: CT HEAD WITHOUT CONTRAST  TECHNIQUE: Contiguous axial images were obtained from the base of the skull through the vertex without intravenous contrast.  COMPARISON:  None.  FINDINGS: The brain has a normal appearance without evidence of atrophy, infarction, mass lesion, hemorrhage, hydrocephalus or extra-axial collection. The calvarium is unremarkable. The paranasal sinuses, middle ears and mastoids are clear.  IMPRESSION: Normal head CT   Electronically Signed   By: [name] M.D.   On: 10/23/2014 20:25   Dg Chest Port 1 View  10/23/2014   CLINICAL DATA:  Acute chest pain and palpitations.  EXAM: PORTABLE CHEST - 1 VIEW  COMPARISON:  04/21/2017 and 12/09/2008  FINDINGS: The cardiomediastinal silhouette is unchanged.  Elevation of the right hemidiaphragm again noted in this low volume film.  There may be mild pulmonary vascular congestion present.  There is no evidence of focal airspace disease, pulmonary edema, suspicious pulmonary nodule/mass, pleural effusion, or pneumothorax. No acute bony abnormalities are identified.  IMPRESSION: Low volume film with possible mild pulmonary vascular congestion.   Electronically Signed   By: Harmon Pier M.D.   On: 10/23/2014 20:00   cxr reviewed normal ekg reviewed  nsr no acute issue Case discussed with edp dr Hyacinth Meeker Old records reviewed   Assessment/Plan  34yo male with atypical chest pain, syncopal episode /AMS  Principal Problem:   Syncopewith ams-  Neurology has seen recommmend mri head and c spine.  Obtain freq neuro checks.  Ck 2 d echo in am.  Monitor on tele overnight for  signficant arrythmias.  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  Active Problems:   S/P gastric bypass   Vitamin D deficiency   Vitamin B12 deficiency   OSA (obstructive sleep apnea)  chest pain - serial troponins, check 2 D cardiac echo in am.  Cp free now.  obs on tele.  Full code. Pt seen after midnight Rosamae Rocque A 10/23/2014, 11:20 PM

## 2014-10-23 NOTE — ED Notes (Signed)
CT notified of priority on pt for CT exam

## 2014-10-24 ENCOUNTER — Other Ambulatory Visit (HOSPITAL_COMMUNITY): Payer: Self-pay

## 2014-10-24 ENCOUNTER — Encounter (HOSPITAL_COMMUNITY): Payer: Self-pay | Admitting: Emergency Medicine

## 2014-10-24 DIAGNOSIS — F488 Other specified nonpsychotic mental disorders: Secondary | ICD-10-CM | POA: Diagnosis not present

## 2014-10-24 DIAGNOSIS — R55 Syncope and collapse: Secondary | ICD-10-CM

## 2014-10-24 DIAGNOSIS — G4733 Obstructive sleep apnea (adult) (pediatric): Secondary | ICD-10-CM

## 2014-10-24 DIAGNOSIS — Z9884 Bariatric surgery status: Secondary | ICD-10-CM

## 2014-10-24 DIAGNOSIS — R4182 Altered mental status, unspecified: Secondary | ICD-10-CM | POA: Diagnosis not present

## 2014-10-24 DIAGNOSIS — R531 Weakness: Secondary | ICD-10-CM

## 2014-10-24 LAB — RAPID URINE DRUG SCREEN, HOSP PERFORMED
Amphetamines: NOT DETECTED
BARBITURATES: NOT DETECTED
Benzodiazepines: NOT DETECTED
COCAINE: NOT DETECTED
OPIATES: NOT DETECTED
TETRAHYDROCANNABINOL: NOT DETECTED

## 2014-10-24 LAB — TROPONIN I: Troponin I: <0.03 ng/mL (ref <0.031)

## 2014-10-24 MED ORDER — SODIUM CHLORIDE 0.9 % IJ SOLN: 3.0000 mL | Freq: Two times a day (BID) | INTRAMUSCULAR | Status: DC

## 2014-10-24 MED ORDER — ONDANSETRON HCL 4 MG/2ML IJ SOLN: 4.0000 mg | Freq: Three times a day (TID) | INTRAMUSCULAR | Status: AC | PRN

## 2014-10-24 MED ORDER — SODIUM CHLORIDE 0.9 % IJ SOLN: 3.0000 mL | INTRAMUSCULAR | Status: DC | PRN

## 2014-10-24 MED ORDER — ASPIRIN EC 81 MG PO TBEC
81.0000 mg | DELAYED_RELEASE_TABLET | Freq: Every day | ORAL | Status: DC
  Filled 2014-10-24: qty 1

## 2014-10-24 MED ORDER — SODIUM CHLORIDE 0.9 % IV SOLN: 250.0000 mL | INTRAVENOUS | Status: DC | PRN

## 2014-10-24 NOTE — Progress Notes (Signed)
Patient discharge teaching given, including activity, diet, follow-up appoints, and medications. Patient verbalized understanding of all discharge instructions. IV access was d/c'd. Vitals are stable. Skin is intact except as charted in most recent assessments. Pt to be escorted out by NT, to be driven home by family. 

## 2014-10-24 NOTE — Progress Notes (Signed)
NURSING PROGRESS NOTE  Vincent Diaz [id number] Admission Data: 10/24/2014 3:31 AM Attending Provider: Haydee Monica, MD RFX:[id number] Vincent Hones, MD Code Status: full  Vincent Diaz is a 34 y.o. male patient admitted from ED:  -No acute distress noted.  -No complaints of shortness of breath.  -No complaints of chest pain.   Cardiac Monitoring: Box # [id number] in place. Cardiac monitor yields: Sinus Brady  Blood pressure 120/69, pulse 65, temperature 98 F (36.7 C), temperature source Oral, resp. rate 17, height 5\' 10"  (1.778 m), weight 117.935 kg (260 lb), SpO2 100 %.   IV Fluids:  IV in place, occlusive dsg intact without redness, IV cath antecubital left, condition patent and no redness none.   Allergies:  Review of patient's allergies indicates no known allergies.  Past Medical History:   has a past medical history of History of chicken pox; Headache; Premature ejaculation; Vitamin D deficiency; Vitamin B12 deficiency; S/P gastric bypass (2010); OSA (obstructive sleep apnea) (2012); and ED (erectile dysfunction).  Past Surgical History:   has past surgical history that includes Gastric bypass (2010).  Social History:   reports that he has never smoked. He has never used smokeless tobacco. He reports that he drinks alcohol. He reports that he does not use illicit drugs.  Skin: WDL  Patient/Family oriented to room. Information packet given to patient/family. Admission inpatient armband information verified with patient/family to include name and date of birth and placed on patient arm. Side rails up x 2, fall assessment and education completed with patient/family. Patient/family able to verbalize understanding of risk associated with falls and verbalized understanding to call for assistance before getting out of bed. Call light within reach. Patient/family able to voice and demonstrate understanding of unit orientation instructions.

## 2014-10-24 NOTE — Discharge Summary (Signed)
Physician Discharge Summary  Vincent Diaz EXB:[id number] DOB: [birth year] DOA: 10/23/2014  PCP: [name], MD  Admit date: 10/23/2014 Discharge date: 10/24/2014  Time spent: 25 minutes  Recommendations for Outpatient Follow-up:   Follow-up with PCP as an outpatient.  Discharge Diagnoses:  Principal Problem:   Syncope Active Problems:   S/P gastric bypass   Vitamin D deficiency   Vitamin B12 deficiency   OSA (obstructive sleep apnea)   Discharge Condition: Stable  Diet recommendation: regular  Filed Weights   10/23/14 1915  Weight: 117.935 kg (260 lb)    History of present illness:  34 year old male with no significant past medical history that comes in for dizziness and passing out, since arrival to the ED he was altered and slow to respond moving all 4 extremities slowly. Patient shows very poor effort of moving on command. So we are consulted further evaluation.  Hospital Course:  Syncope: Most likely psychogenic, neurology was consulted and recommended an MRI of the head and C-spine did not show any acute pathology, chest x-ray was negative, as cardiac enzymes were cycled which were negative 3, he was monitored on telemetry with no events, by the next day he was moving all fluctuations without any difficulty. So at discharge: To follow-up with his PCP as an outpatient  Procedures:  MRI of brain  Consultations:  Neurology  Discharge Exam: Filed Vitals:   10/24/14 0606  BP: 131/83  Pulse: 53  Temp: 98.1 F (36.7 C)  Resp: 18    General: A&O x3 Cardiovascular: RRR Respiratory: good air movement CTA B/:  Discharge Instructions   Discharge Instructions    Diet - low sodium heart healthy    Complete by:  As directed      Increase activity slowly    Complete by:  As directed           Discharge Medication List as of 10/24/2014 10:41 AM    CONTINUE these medications which have NOT CHANGED   Details  ibuprofen (ADVIL,MOTRIN) 200 MG  tablet Take 200 mg by mouth every 6 (six) hours as needed for headache (pain)., Until Discontinued, Historical Med      STOP taking these medications     Cholecalciferol (VITAMIN D3) 2000 UNITS capsule      promethazine (PHENERGAN) 25 MG tablet      sildenafil (VIAGRA) 100 MG tablet        No Known Allergies Follow-up Information    Follow up with [name], MD In 2 weeks.   Specialty:  Family Medicine   Why:  hospital follow up   Contact information:   [address] [place] [address] [phone]        The results of significant diagnostics from this hospitalization (including imaging, microbiology, ancillary and laboratory) are listed below for reference.    Significant Diagnostic Studies: Ct Head Wo Contrast  10/23/2014   CLINICAL DATA:  Syncopal episode while cooking outside.  EXAM: CT HEAD WITHOUT CONTRAST  TECHNIQUE: Contiguous axial images were obtained from the base of the skull through the vertex without intravenous contrast.  COMPARISON:  None.  FINDINGS: The brain has a normal appearance without evidence of atrophy, infarction, mass lesion, hemorrhage, hydrocephalus or extra-axial collection. The calvarium is unremarkable. The paranasal sinuses, middle ears and mastoids are clear.  IMPRESSION: Normal head CT   Electronically Signed   By: [name] M.D.   On: 10/23/2014 20:25   Mr Brain Wo Contrast  10/24/2014   CLINICAL DATA:  Acute onset chest pain, tachycardia and dizziness while outside grilling this afternoon, syncopal episode. History of headache, vitamin B12 deficiency, status post gastric bypass.  EXAM: MRI HEAD WITHOUT CONTRAST  MRI CERVICAL SPINE WITHOUT CONTRAST  TECHNIQUE: Multiplanar, multiecho pulse sequences of the brain and surrounding structures, and cervical spine, to include the craniocervical junction and cervicothoracic junction, were obtained without intravenous contrast.  COMPARISON:  CT head October 23, 2014 at 2012 hours   FINDINGS: MRI HEAD FINDINGS  The ventricles and sulci are normal for patient's age. No abnormal parenchymal signal, mass lesions, mass effect. No reduced diffusion to suggest acute ischemia. No susceptibility artifact to suggest hemorrhage. Scattered subcentimeter supratentorial white matter FLAIR T2 hyperintensities prominently in the subcortical white matter, advanced for age.  No abnormal extra-axial fluid collections. No extra-axial masses though, contrast enhanced sequences would be more sensitive. Normal major intracranial vascular flow voids seen at the skull base.  Ocular globes and orbital contents are unremarkable though not tailored for evaluation. No abnormal sellar expansion. Trace paranasal sinus mucosal thickening without air-fluid levels. The mastoid air cells are well aerated. Mildly heterogeneous bone marrow signal. No abnormal sellar expansion. Craniocervical junction maintained.  MRI CERVICAL SPINE FINDINGS  Cervical vertebral bodies and posterior elements are intact aligned, and the maintenance of cervical lordosis. Heterogeneous bone marrow signal, no STIR signal abnormality to suggest acute osseous process. Intervertebral discs demonstrate normal morphology and signal characteristics.  Cervical spinal cord is normal morphology and signal characteristics from the cervical medullary junction to the level of T2, the most caudal well visualized level. Craniocervical junction is intact. Mild fullness of the adenoidal soft tissues, paraspinal soft tissues are normal.  Level by level evaluation:  C2-3 through C7-T1: No disc bulge, canal stenosis or neural foraminal narrowing. Multilevel mild facet arthropathy.  IMPRESSION: MRI brain: No acute intracranial process.  Mild to moderate white matter changes, advanced for age. These could reflect chronic small vessel ischemic disease, prior infectious or inflammatory process. Recommend followup MRI of the brain and six-month to verify stability of  findings.  MRI cervical spine: No acute fracture, malalignment nor neural compressive changes.  Heterogeneously low bone marrow signal, nonspecific, can be seen with anemia.   Electronically Signed   By: [name] M.D.   On: 10/24/2014 00:33   Mr Cervical Spine Wo Contrast  10/24/2014   CLINICAL DATA:  Acute onset chest pain, tachycardia and dizziness while outside grilling this afternoon, syncopal episode. History of headache, vitamin B12 deficiency, status post gastric bypass.  EXAM: MRI HEAD WITHOUT CONTRAST  MRI CERVICAL SPINE WITHOUT CONTRAST  TECHNIQUE: Multiplanar, multiecho pulse sequences of the brain and surrounding structures, and cervical spine, to include the craniocervical junction and cervicothoracic junction, were obtained without intravenous contrast.  COMPARISON:  CT head October 23, 2014 at 2012 hours  FINDINGS: MRI HEAD FINDINGS  The ventricles and sulci are normal for patient's age. No abnormal parenchymal signal, mass lesions, mass effect. No reduced diffusion to suggest acute ischemia. No susceptibility artifact to suggest hemorrhage. Scattered subcentimeter supratentorial white matter FLAIR T2 hyperintensities prominently in the subcortical white matter, advanced for age.  No abnormal extra-axial fluid collections. No extra-axial masses though, contrast enhanced sequences would be more sensitive. Normal major intracranial vascular flow voids seen at the skull base.  Ocular globes and orbital contents are unremarkable though not tailored for evaluation. No abnormal sellar expansion. Trace paranasal sinus mucosal thickening without air-fluid levels. The mastoid air cells are well aerated. Mildly heterogeneous bone marrow signal. No  abnormal sellar expansion. Craniocervical junction maintained.  MRI CERVICAL SPINE FINDINGS  Cervical vertebral bodies and posterior elements are intact aligned, and the maintenance of cervical lordosis. Heterogeneous bone marrow signal, no STIR signal  abnormality to suggest acute osseous process. Intervertebral discs demonstrate normal morphology and signal characteristics.  Cervical spinal cord is normal morphology and signal characteristics from the cervical medullary junction to the level of T2, the most caudal well visualized level. Craniocervical junction is intact. Mild fullness of the adenoidal soft tissues, paraspinal soft tissues are normal.  Level by level evaluation:  C2-3 through C7-T1: No disc bulge, canal stenosis or neural foraminal narrowing. Multilevel mild facet arthropathy.  IMPRESSION: MRI brain: No acute intracranial process.  Mild to moderate white matter changes, advanced for age. These could reflect chronic small vessel ischemic disease, prior infectious or inflammatory process. Recommend followup MRI of the brain and six-month to verify stability of findings.  MRI cervical spine: No acute fracture, malalignment nor neural compressive changes.  Heterogeneously low bone marrow signal, nonspecific, can be seen with anemia.   Electronically Signed   By: [name] M.D.   On: 10/24/2014 00:33   Dg Chest Port 1 View  10/23/2014   CLINICAL DATA:  Acute chest pain and palpitations.  EXAM: PORTABLE CHEST - 1 VIEW  COMPARISON:  04/21/2017 and 12/09/2008  FINDINGS: The cardiomediastinal silhouette is unchanged.  Elevation of the right hemidiaphragm again noted in this low volume film.  There may be mild pulmonary vascular congestion present.  There is no evidence of focal airspace disease, pulmonary edema, suspicious pulmonary nodule/mass, pleural effusion, or pneumothorax. No acute bony abnormalities are identified.  IMPRESSION: Low volume film with possible mild pulmonary vascular congestion.   Electronically Signed   By: Harmon Pier M.D.   On: 10/23/2014 20:00    Microbiology: No results found for this or any previous visit (from the past 240 hour(s)).   Labs: Basic Metabolic Panel:  Recent Labs Lab 10/23/14 1933  10/23/14 1951  NA 141 145  K 3.5 3.5  CL 112* 111  CO2 22  --   GLUCOSE 74 70  BUN 16 17  CREATININE 1.13 1.10  CALCIUM 8.4*  --    Liver Function Tests: No results for input(s): AST, ALT, ALKPHOS, BILITOT, PROT, ALBUMIN in the last 168 hours. No results for input(s): LIPASE, AMYLASE in the last 168 hours. No results for input(s): AMMONIA in the last 168 hours. CBC:  Recent Labs Lab 10/23/14 1933 10/23/14 1951  WBC 7.2  --   HGB 12.1* 12.9*  HCT 37.5* 38.0*  MCV 81.9  --   PLT 239  --    Cardiac Enzymes:  Recent Labs Lab 10/24/14 0322 10/24/14 0751  TROPONINI <0.03 <0.03   BNP: BNP (last 3 results) No results for input(s): BNP in the last 8760 hours.  ProBNP (last 3 results) No results for input(s): PROBNP in the last 8760 hours.  CBG: No results for input(s): GLUCAP in the last 168 hours.     Signed:  Marinda Elk  [facility] 10/24/2014, 2:22 PM

## 2014-10-24 NOTE — ED Notes (Signed)
Onalee Hua, MD Neuro called- informed of patients return to room 18 for evaluation

## 2014-10-24 NOTE — Progress Notes (Deleted)
Physician Discharge Summary  Vincent Diaz ZOX:[id number] DOB: 1980/12/17 DOA: 10/23/2014  PCP: [name], MD  Admit date: 10/23/2014 Discharge date: 10/24/2014  Time spent: 25* minutes  Recommendations for Outpatient Follow-up:  1. Follow-up with PCP as an outpatient.  Discharge Diagnoses:  Principal Problem:   Syncope Active Problems:   S/P gastric bypass   Vitamin D deficiency   Vitamin B12 deficiency   OSA (obstructive sleep apnea)   Discharge Condition: stable  Diet recommendation: regular  Filed Weights   10/23/14 1915  Weight: 117.935 kg (260 lb)    History of present illness:  34 year old male with no significant past medical history that comes in for dizziness and passing out, since arrival to the ED he was altered and slow to respond moving all 4 extremities slowly. Patient shows very poor effort of moving on command. So we are consulted further evaluation.  Hospital Course:  Syncope: Most likely psychogenic, neurology was consulted and recommended an MRI of the head and C-spine did not show any acute pathology, chest x-ray was negative, as cardiac enzymes were cycled which were negative 3, he was monitored on telemetry with no events, by the next day he was moving all fluctuations without any difficulty. So at discharge: To follow-up with his PCP as an outpatient.   Procedures:  MRI of brain  Consultations:  Neurology  Discharge Exam: Filed Vitals:   10/24/14 0606  BP: 131/83  Pulse: 53  Temp: 98.1 F (36.7 C)  Resp: 18    General: Awake alert and oriented 3 Cardiovascular: Regular rate and rhythm Respiratory: Good air movement clear to auscultation.  Discharge Instructions   Discharge Instructions    Diet - low sodium heart healthy    Complete by:  As directed      Increase activity slowly    Complete by:  As directed           Current Discharge Medication List    CONTINUE these medications which have NOT CHANGED    Details  ibuprofen (ADVIL,MOTRIN) 200 MG tablet Take 200 mg by mouth every 6 (six) hours as needed for headache (pain).      STOP taking these medications     Cholecalciferol (VITAMIN D3) 2000 UNITS capsule      promethazine (PHENERGAN) 25 MG tablet      sildenafil (VIAGRA) 100 MG tablet        No Known Allergies Follow-up Information    Follow up with [name], MD In 2 weeks.   Specialty:  Family Medicine   Why:  hospital follow up   Contact information:   [address] [place] [address] [phone]        The results of significant diagnostics from this hospitalization (including imaging, microbiology, ancillary and laboratory) are listed below for reference.    Significant Diagnostic Studies: Ct Head Wo Contrast  10/23/2014   CLINICAL DATA:  Syncopal episode while cooking outside.  EXAM: CT HEAD WITHOUT CONTRAST  TECHNIQUE: Contiguous axial images were obtained from the base of the skull through the vertex without intravenous contrast.  COMPARISON:  None.  FINDINGS: The brain has a normal appearance without evidence of atrophy, infarction, mass lesion, hemorrhage, hydrocephalus or extra-axial collection. The calvarium is unremarkable. The paranasal sinuses, middle ears and mastoids are clear.  IMPRESSION: Normal head CT   Electronically Signed   By: [name] M.D.   On: 10/23/2014 20:25   Mr Brain Wo Contrast  10/24/2014   CLINICAL DATA:  Acute onset chest pain, tachycardia and dizziness while outside grilling this afternoon, syncopal episode. History of headache, vitamin B12 deficiency, status post gastric bypass.  EXAM: MRI HEAD WITHOUT CONTRAST  MRI CERVICAL SPINE WITHOUT CONTRAST  TECHNIQUE: Multiplanar, multiecho pulse sequences of the brain and surrounding structures, and cervical spine, to include the craniocervical junction and cervicothoracic junction, were obtained without intravenous contrast.  COMPARISON:  CT head October 23, 2014 at 2012 hours   FINDINGS: MRI HEAD FINDINGS  The ventricles and sulci are normal for patient's age. No abnormal parenchymal signal, mass lesions, mass effect. No reduced diffusion to suggest acute ischemia. No susceptibility artifact to suggest hemorrhage. Scattered subcentimeter supratentorial white matter FLAIR T2 hyperintensities prominently in the subcortical white matter, advanced for age.  No abnormal extra-axial fluid collections. No extra-axial masses though, contrast enhanced sequences would be more sensitive. Normal major intracranial vascular flow voids seen at the skull base.  Ocular globes and orbital contents are unremarkable though not tailored for evaluation. No abnormal sellar expansion. Trace paranasal sinus mucosal thickening without air-fluid levels. The mastoid air cells are well aerated. Mildly heterogeneous bone marrow signal. No abnormal sellar expansion. Craniocervical junction maintained.  MRI CERVICAL SPINE FINDINGS  Cervical vertebral bodies and posterior elements are intact aligned, and the maintenance of cervical lordosis. Heterogeneous bone marrow signal, no STIR signal abnormality to suggest acute osseous process. Intervertebral discs demonstrate normal morphology and signal characteristics.  Cervical spinal cord is normal morphology and signal characteristics from the cervical medullary junction to the level of T2, the most caudal well visualized level. Craniocervical junction is intact. Mild fullness of the adenoidal soft tissues, paraspinal soft tissues are normal.  Level by level evaluation:  C2-3 through C7-T1: No disc bulge, canal stenosis or neural foraminal narrowing. Multilevel mild facet arthropathy.  IMPRESSION: MRI brain: No acute intracranial process.  Mild to moderate white matter changes, advanced for age. These could reflect chronic small vessel ischemic disease, prior infectious or inflammatory process. Recommend followup MRI of the brain and six-month to verify stability of  findings.  MRI cervical spine: No acute fracture, malalignment nor neural compressive changes.  Heterogeneously low bone marrow signal, nonspecific, can be seen with anemia.   Electronically Signed   By: [name] M.D.   On: 10/24/2014 00:33   Mr Cervical Spine Wo Contrast  10/24/2014   CLINICAL DATA:  Acute onset chest pain, tachycardia and dizziness while outside grilling this afternoon, syncopal episode. History of headache, vitamin B12 deficiency, status post gastric bypass.  EXAM: MRI HEAD WITHOUT CONTRAST  MRI CERVICAL SPINE WITHOUT CONTRAST  TECHNIQUE: Multiplanar, multiecho pulse sequences of the brain and surrounding structures, and cervical spine, to include the craniocervical junction and cervicothoracic junction, were obtained without intravenous contrast.  COMPARISON:  CT head October 23, 2014 at 2012 hours  FINDINGS: MRI HEAD FINDINGS  The ventricles and sulci are normal for patient's age. No abnormal parenchymal signal, mass lesions, mass effect. No reduced diffusion to suggest acute ischemia. No susceptibility artifact to suggest hemorrhage. Scattered subcentimeter supratentorial white matter FLAIR T2 hyperintensities prominently in the subcortical white matter, advanced for age.  No abnormal extra-axial fluid collections. No extra-axial masses though, contrast enhanced sequences would be more sensitive. Normal major intracranial vascular flow voids seen at the skull base.  Ocular globes and orbital contents are unremarkable though not tailored for evaluation. No abnormal sellar expansion. Trace paranasal sinus mucosal thickening without air-fluid levels. The mastoid air cells are well aerated. Mildly heterogeneous bone marrow signal. No  abnormal sellar expansion. Craniocervical junction maintained.  MRI CERVICAL SPINE FINDINGS  Cervical vertebral bodies and posterior elements are intact aligned, and the maintenance of cervical lordosis. Heterogeneous bone marrow signal, no STIR signal  abnormality to suggest acute osseous process. Intervertebral discs demonstrate normal morphology and signal characteristics.  Cervical spinal cord is normal morphology and signal characteristics from the cervical medullary junction to the level of T2, the most caudal well visualized level. Craniocervical junction is intact. Mild fullness of the adenoidal soft tissues, paraspinal soft tissues are normal.  Level by level evaluation:  C2-3 through C7-T1: No disc bulge, canal stenosis or neural foraminal narrowing. Multilevel mild facet arthropathy.  IMPRESSION: MRI brain: No acute intracranial process.  Mild to moderate white matter changes, advanced for age. These could reflect chronic small vessel ischemic disease, prior infectious or inflammatory process. Recommend followup MRI of the brain and six-month to verify stability of findings.  MRI cervical spine: No acute fracture, malalignment nor neural compressive changes.  Heterogeneously low bone marrow signal, nonspecific, can be seen with anemia.   Electronically Signed   By: [name] M.D.   On: 10/24/2014 00:33   Dg Chest Port 1 View  10/23/2014   CLINICAL DATA:  Acute chest pain and palpitations.  EXAM: PORTABLE CHEST - 1 VIEW  COMPARISON:  04/21/2017 and 12/09/2008  FINDINGS: The cardiomediastinal silhouette is unchanged.  Elevation of the right hemidiaphragm again noted in this low volume film.  There may be mild pulmonary vascular congestion present.  There is no evidence of focal airspace disease, pulmonary edema, suspicious pulmonary nodule/mass, pleural effusion, or pneumothorax. No acute bony abnormalities are identified.  IMPRESSION: Low volume film with possible mild pulmonary vascular congestion.   Electronically Signed   By: Harmon Pier M.D.   On: 10/23/2014 20:00    Microbiology: No results found for this or any previous visit (from the past 240 hour(s)).   Labs: Basic Metabolic Panel:  Recent Labs Lab 10/23/14 1933  10/23/14 1951  NA 141 145  K 3.5 3.5  CL 112* 111  CO2 22  --   GLUCOSE 74 70  BUN 16 17  CREATININE 1.13 1.10  CALCIUM 8.4*  --    Liver Function Tests: No results for input(s): AST, ALT, ALKPHOS, BILITOT, PROT, ALBUMIN in the last 168 hours. No results for input(s): LIPASE, AMYLASE in the last 168 hours. No results for input(s): AMMONIA in the last 168 hours. CBC:  Recent Labs Lab 10/23/14 1933 10/23/14 1951  WBC 7.2  --   HGB 12.1* 12.9*  HCT 37.5* 38.0*  MCV 81.9  --   PLT 239  --    Cardiac Enzymes:  Recent Labs Lab 10/24/14 0322 10/24/14 0751  TROPONINI <0.03 <0.03   BNP: BNP (last 3 results) No results for input(s): BNP in the last 8760 hours.  ProBNP (last 3 results) No results for input(s): PROBNP in the last 8760 hours.  CBG: No results for input(s): GLUCAP in the last 168 hours.     Signed:  Marinda Elk  [facility] 10/24/2014, 10:22 AM

## 2014-10-25 ENCOUNTER — Telehealth: Payer: Self-pay | Admitting: Family Medicine

## 2014-10-25 NOTE — Telephone Encounter (Signed)
Spoke with patient. He is feeling some better. He is just taking it easy this week and trying to get his strength back. Appt scheduled.

## 2014-10-25 NOTE — Telephone Encounter (Signed)
Pt admitted over weekend with syncope. plz call for hosp f/u phone call and schedule f/u within next 2 weeks. Thanks.

## 2014-10-29 ENCOUNTER — Ambulatory Visit: Payer: Self-pay | Admitting: Family Medicine

## 2015-05-01 ENCOUNTER — Ambulatory Visit (HOSPITAL_BASED_OUTPATIENT_CLINIC_OR_DEPARTMENT_OTHER): Payer: Medicaid Other | Attending: Family Medicine

## 2015-05-01 VITALS — Ht 70.0 in | Wt 265.0 lb

## 2015-05-01 DIAGNOSIS — I493 Ventricular premature depolarization: Secondary | ICD-10-CM | POA: Diagnosis not present

## 2015-05-01 DIAGNOSIS — G4733 Obstructive sleep apnea (adult) (pediatric): Secondary | ICD-10-CM | POA: Diagnosis present

## 2015-05-01 DIAGNOSIS — R0683 Snoring: Secondary | ICD-10-CM | POA: Insufficient documentation

## 2015-05-15 DIAGNOSIS — G4733 Obstructive sleep apnea (adult) (pediatric): Secondary | ICD-10-CM | POA: Diagnosis not present

## 2015-05-15 NOTE — Progress Notes (Signed)
   NAME: Vincent Diaz DATE OF BIRTH:  [birth year] MEDICAL RECORD NUMBER [id number]  LOCATION: [facility]  PHYSICIAN: Ronit Cranfield D  DATE OF STUDY: 05/01/2015 CLINICAL INFORMATION Sleep Study Type: NPSG Indication for sleep study: OSA Epworth Sleepiness Score: 4  SLEEP STUDY TECHNIQUE As per the AASM Manual for the Scoring of Sleep and Associated Events v2.3 (April 2016) with a hypopnea requiring 4% desaturations. The channels recorded and monitored were frontal, central and occipital EEG, electrooculogram (EOG), submentalis EMG (chin), nasal and oral airflow, thoracic and abdominal wall motion, anterior tibialis EMG, snore microphone, electrocardiogram, and pulse oximetry.  MEDICATIONS Patient's medications include: charted for review Medications self-administered by patient during sleep study : No sleep medicine administered.  SLEEP ARCHITECTURE The study was initiated at 10:59:28 PM and ended at 5:09:44 AM. Sleep onset time was 34.2 minutes and the sleep efficiency was 87.4%. The total sleep time was 323.5 minutes. Stage REM latency was 55.5 minutes. The patient spent 7.11% of the night in stage N1 sleep, 76.51% in stage N2 sleep, 0.00% in stage N3 and 16.38% in REM. Alpha intrusion was absent. Supine sleep was 11.73%.  RESPIRATORY PARAMETERS The overall apnea/hypopnea index (AHI) was 2.8 per hour. There were 9 total apneas, including 9 obstructive, 0 central and 0 mixed apneas. There were 6 hypopneas and 46 RERAs. The AHI during Stage REM sleep was 6.8 per hour. AHI while supine was 3.2 per hour. The mean oxygen saturation was 95.43%. The minimum SpO2 during sleep was 89.00%. Loud snoring was noted during this study.  CARDIAC DATA The 2 lead EKG demonstrated sinus rhythm. The mean heart rate was 57.90 beats per minute. Other EKG findings include: PVCs.  LEG MOVEMENT DATA The total PLMS were 0 with a resulting PLMS index of 0.00. Associated arousal  with leg movement index was 0.0 .  IMPRESSIONS - No significant obstructive sleep apnea occurred during this study (AHI = 2.8/h). - No significant central sleep apnea occurred during this study (CAI = 0.0/h). - Mild oxygen desaturation was noted during this study (Min O2 = 89.00%). - The patient snored with Loud snoring volume. - EKG findings include PVCs. - Clinically significant periodic limb movements did not occur during sleep. No significant associated arousals.  DIAGNOSIS - Primary Snoring (786.09 [R06.83 ICD-10])  RECOMMENDATIONS - Avoid alcohol, sedatives and other CNS depressants that may worsen sleep apnea and disrupt normal sleep architecture. - Sleep hygiene should be reviewed to assess factors that may improve sleep quality. - Weight management and regular exercise should be initiated or continued if appropriate.   Waymon BudgeYOUNG,Ugo Thoma D Diplomate, American Board of Sleep Medicine  ELECTRONICALLY SIGNED ON:  05/15/2015, 11:38 AM [facility] PH: [phone]   FX: [phone] ACCREDITED BY THE AMERICAN ACADEMY OF SLEEP MEDICINE

## 2015-05-16 ENCOUNTER — Emergency Department (HOSPITAL_COMMUNITY)
Admission: EM | Admit: 2015-05-16 | Discharge: 2015-05-16 | Disposition: A | Discharge status: Home / Self Care | Payer: Medicaid Other | Source: Home / Self Care | Attending: Family Medicine | Admitting: Family Medicine

## 2015-05-16 ENCOUNTER — Encounter (HOSPITAL_COMMUNITY): Payer: Self-pay | Admitting: *Deleted

## 2015-05-16 DIAGNOSIS — J069 Acute upper respiratory infection, unspecified: Secondary | ICD-10-CM

## 2015-05-16 LAB — POCT RAPID STREP A: STREPTOCOCCUS, GROUP A SCREEN (DIRECT): NEGATIVE

## 2015-05-16 NOTE — ED Notes (Signed)
Pt  Reports       Fever    X  2  Days   sorethroat  Fever    Body  Aches

## 2015-05-16 NOTE — ED Provider Notes (Signed)
CSN: [id number]     Arrival date & time 05/16/15  1326 History   First MD Initiated Contact with Patient 05/16/15 1548     Chief Complaint  Patient presents with  . Fever   (Consider location/radiation/quality/duration/timing/severity/associated sxs/prior Treatment) Patient is a 35 y.o. male presenting with URI. The history is provided by the patient.  URI Presenting symptoms: cough, fever and rhinorrhea   Severity:  Mild Onset quality:  Gradual Duration:  2 days Progression:  Unchanged Chronicity:  New Relieved by:  None tried Worsened by:  Nothing tried Ineffective treatments:  None tried Associated symptoms: myalgias   Risk factors: sick contacts     Past Medical History  Diagnosis Date  . History of chicken pox   . Headache   . Premature ejaculation     Dr. Evelene Croon  . Vitamin D deficiency   . Vitamin B12 deficiency   . 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 OSA (obstructive sleep apnea) 2012    mild, AHI 5.51 during total sleep time, 13.87 during REM sleep, rec CPAP (Dr. Mayford Knife)  . ED (erectile dysfunction)    Past Surgical History  Procedure Laterality Date  . Gastric bypass  2010    Roux en Y ([facility])   Family History  Problem Relation Age of Onset  . Hypertension Paternal Grandmother   . Cancer Maternal Aunt     breast  . Drug abuse Mother     h/o  . Cancer Maternal Uncle 55    prostate (age 10-60s)  . Cancer Mother     under evaluation for pancreatic cancer  . Diabetes Neg Hx   . CAD Neg Hx   . Stroke Neg Hx    Social History  Substance Use Topics  . Smoking status: Never Smoker   . Smokeless tobacco: Never Used  . Alcohol Use: Yes     Comment: seldom    Review of Systems  Constitutional: Positive for fever and chills.  HENT: Positive for rhinorrhea.   Respiratory: Positive for cough.   Gastrointestinal: Negative.   Musculoskeletal: Positive for myalgias.  Skin: Negative.   All other systems reviewed and are  negative.   Allergies  Review of patient's allergies indicates no known allergies.  Home Medications   Prior to Admission medications   Medication Sig Start Date End Date Taking? Authorizing Provider  ibuprofen (ADVIL,MOTRIN) 200 MG tablet Take 200 mg by mouth every 6 (six) hours as needed for headache (pain).    Historical Provider, MD   Meds Ordered and Administered this Visit  Medications - No data to display  BP 150/91 mmHg  Pulse 88  Temp(Src) 102.8 F (39.3 C) (Oral)  Resp 20  SpO2 100% No data found.   Physical Exam  Constitutional: He is oriented to person, place, and time. He appears well-developed and well-nourished.  HENT:  Right Ear: External ear normal.  Left Ear: External ear normal.  Mouth/Throat: Oropharynx is clear and moist.  Neck: Normal range of motion. Neck supple.  Cardiovascular: Regular rhythm, normal heart sounds and intact distal pulses.   Pulmonary/Chest: Effort normal and breath sounds normal.  Lymphadenopathy:    He has no cervical adenopathy.  Neurological: He is alert and oriented to person, place, and time.  Skin: Skin is warm and dry.  Nursing note and vitals reviewed.   ED Course  Procedures (including critical care time)  Labs Review Labs Reviewed  POCT RAPID STREP A  Imaging Review No results found.   Visual Acuity Review  Right Eye Distance:   Left Eye Distance:   Bilateral Distance:    Right Eye Near:   Left Eye Near:    Bilateral Near:         MDM   1. Viral upper respiratory illness        Linna HoffJames D Kindl, MD 05/16/15 1606

## 2015-05-16 NOTE — Discharge Instructions (Signed)
Drink plenty of fluids as discussed, use tylenol or motrin for fever as needed, and mucinex or delsym for cough. Return or see your doctor if further problems

## 2015-05-18 LAB — CULTURE, GROUP A STREP: Strep A Culture: NEGATIVE

## 2016-01-03 ENCOUNTER — Encounter (HOSPITAL_BASED_OUTPATIENT_CLINIC_OR_DEPARTMENT_OTHER): Payer: Self-pay | Admitting: Respiratory Therapy

## 2016-01-03 ENCOUNTER — Emergency Department (HOSPITAL_BASED_OUTPATIENT_CLINIC_OR_DEPARTMENT_OTHER)
Admission: EM | Admit: 2016-01-03 | Discharge: 2016-01-03 | Disposition: A | Discharge status: Home / Self Care | Payer: Medicaid Other | Attending: Physician Assistant | Admitting: Physician Assistant

## 2016-01-03 DIAGNOSIS — R0602 Shortness of breath: Secondary | ICD-10-CM | POA: Insufficient documentation

## 2016-01-03 DIAGNOSIS — R0789 Other chest pain: Secondary | ICD-10-CM | POA: Insufficient documentation

## 2016-01-03 DIAGNOSIS — J029 Acute pharyngitis, unspecified: Secondary | ICD-10-CM | POA: Insufficient documentation

## 2016-01-03 DIAGNOSIS — R066 Hiccough: Secondary | ICD-10-CM | POA: Insufficient documentation

## 2016-01-03 MED ORDER — CHLORPROMAZINE HCL 25 MG PO TABS: 25.0000 mg | ORAL_TABLET | Freq: Three times a day (TID) | ORAL | 0 refills | Status: DC

## 2016-01-03 MED ORDER — OMEPRAZOLE 20 MG PO CPDR: 20.0000 mg | DELAYED_RELEASE_CAPSULE | Freq: Every day | ORAL | 0 refills | Status: DC

## 2016-01-03 NOTE — ED Provider Notes (Signed)
[facility] Provider Note   CSN: [id number] Arrival date & time: 01/03/16  2126 By signing my name below, I, Vincent Diaz, attest that this documentation has been prepared under the direction and in the presence of Vincent Diaz, Vincent Diaz. Electronically Signed: Bridgette HabermannMaria Diaz, ED Scribe. 01/03/16. 10:33 PM.  History   Chief Complaint Chief Complaint  Patient presents with  . Hiccups   HPI Comments: Vincent Diaz is a 35 y.o. male who presents to the Emergency Department complaining of sudden onset, intermittent hiccups onset two weeks ago, worsening today. Pt states that has been having these reoccurring hiccups for a while, they go away for 15 minutes but then come back. Pt also has associated chest tightness, shortness of breath, and sore throat onset today. No alleviating factors tried PTA. Pt has no h/o heart disease. Denies h/o GERD. Pt denies fever.   The history is provided by the patient. No language interpreter was used.    Past Medical History:  Diagnosis Date  . ED (erectile dysfunction)   . Headache   . History of chicken pox   . OSA (obstructive sleep apnea) 2012   mild, AHI 5.51 during total sleep time, 13.87 during REM sleep, rec CPAP (Dr. Mayford Knifeurner)  . Premature ejaculation    Dr. Evelene CroonWolff  . 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 Vitamin B12 deficiency   . Vitamin D deficiency     Patient Active Problem List   Diagnosis Date Noted  . Altered mental status   . Syncope 10/23/2014  . OSA (obstructive sleep apnea)   . Weakness   . Gastroenteritis 07/18/2013  . Healthcare maintenance 04/27/2013  . ED (erectile dysfunction)   . Vitamin D deficiency   . Vitamin B12 deficiency   . Premature ejaculation   . Headache 01/26/2013  . Obesity 01/26/2013  . S/P gastric bypass 01/26/2013    Past Surgical History:  Procedure Laterality Date  . GASTRIC BYPASS  2010   Roux en Y ([facility])       Home Medications    Prior to Admission  medications   Medication Sig Start Date End Date Taking? Authorizing Provider  ibuprofen (ADVIL,MOTRIN) 200 MG tablet Take 200 mg by mouth every 6 (six) hours as needed for headache (pain).    Historical Provider, Vincent Diaz    Family History Family History  Problem Relation Age of Onset  . Hypertension Paternal Grandmother   . Cancer Maternal Aunt     breast  . Drug abuse Mother     h/o  . Cancer Mother     under evaluation for pancreatic cancer  . Cancer Maternal Uncle 5055    prostate (age 35-60s)  . Diabetes Neg Hx   . CAD Neg Hx   . Stroke Neg Hx     Social History Social History  Substance Use Topics  . Smoking status: Never Smoker  . Smokeless tobacco: Never Used  . Alcohol use Yes     Comment: seldom     Allergies   Review of patient's allergies indicates no known allergies.   Review of Systems Review of Systems  Constitutional: Negative for fever.  HENT: Positive for sore throat.   Respiratory: Positive for chest tightness and shortness of breath.   All other systems reviewed and are negative.    Physical Exam Updated Vital Signs BP 128/81 (BP Location: Left Arm)   Pulse (!) 55   Temp 98.6 F (37  C) (Oral)   Resp 18   Ht 5\' 10"  (1.778 m)   Wt 265 lb (120.2 kg)   SpO2 100%   BMI 38.02 kg/m   Physical Exam  Constitutional: He appears well-developed and well-nourished.  HENT:  Head: Normocephalic.  Mouth/Throat: Oropharynx is clear and moist.  Eyes: Conjunctivae are normal.  Cardiovascular: Normal rate, regular rhythm and normal heart sounds.  Exam reveals no gallop and no friction rub.   No murmur heard. Pulmonary/Chest: Effort normal and breath sounds normal. No respiratory distress. He has no wheezes. He has no rales.  Abdominal: He exhibits no distension.  Musculoskeletal: Normal range of motion.  Neurological: He is alert.  Skin: Skin is warm and dry.  Psychiatric: He has a normal mood and affect. His behavior is normal.  Nursing note and  vitals reviewed.    ED Treatments / Results  DIAGNOSTIC STUDIES: Oxygen Saturation is 100% on RA, normal by my interpretation.    COORDINATION OF CARE: 10:31 PM Discussed treatment plan with pt at bedside which includes Rx and pt agreed to plan.  Labs (all labs ordered are listed, but only abnormal results are displayed) Labs Reviewed - No data to display  EKG  EKG Interpretation None       Radiology No results found.  Procedures Procedures (including critical care time)  Medications Ordered in ED Medications - No data to display   Initial Impression / Assessment and Plan / ED Course  I have reviewed the triage vital signs and the nursing notes.  Pertinent labs & imaging results that were available during my care of the patient were reviewed by me and considered in my medical decision making (see chart for details).  Clinical Course    Patient is a 35 year old male here with a. Patient's had the hiccups for the last Several Days. Patient Is Also Had Symptoms of GERD. We will treat with omeprazole for the GERD, give thorazine for the hiccups and have him follow up wthi PCP.  Patient's PE and VS are stable.     Final Clinical Impressions(s) / ED Diagnoses   Final diagnoses:  None    New Prescriptions New Prescriptions   No medications on file  I personally performed the services described in this documentation, which was scribed in my presence. The recorded information has been reviewed and is accurate.       Vincent Diaz, Vincent Diaz 01/03/16 2259

## 2016-01-03 NOTE — ED Notes (Signed)
Pt with hiccups x 2 weeks. Reports that they may go away for 15 min at a time then return no other sx noted.

## 2016-01-03 NOTE — Discharge Instructions (Signed)
We are sorry you are having hiccups. Please take the Thorazine for hiccups. Also takes omeprazole to help with any kind of acid reflux that you have.

## 2016-01-03 NOTE — ED Triage Notes (Signed)
Pt c/o recurrent hiccups for the last two weeks.  He is speaking in full sentences, no acute distress noted.

## 2016-02-16 ENCOUNTER — Encounter: Payer: Self-pay | Admitting: Family Medicine

## 2016-02-16 ENCOUNTER — Ambulatory Visit (INDEPENDENT_AMBULATORY_CARE_PROVIDER_SITE_OTHER): Payer: 59 | Admitting: Family Medicine

## 2016-02-16 VITALS — BP 136/88 | HR 60 | Temp 98.6°F | Ht 69.0 in | Wt 265.5 lb

## 2016-02-16 DIAGNOSIS — Z23 Encounter for immunization: Secondary | ICD-10-CM | POA: Diagnosis not present

## 2016-02-16 DIAGNOSIS — E669 Obesity, unspecified: Secondary | ICD-10-CM | POA: Diagnosis not present

## 2016-02-16 DIAGNOSIS — G43009 Migraine without aura, not intractable, without status migrainosus: Secondary | ICD-10-CM

## 2016-02-16 DIAGNOSIS — E538 Deficiency of other specified B group vitamins: Secondary | ICD-10-CM | POA: Diagnosis not present

## 2016-02-16 DIAGNOSIS — Z9884 Bariatric surgery status: Secondary | ICD-10-CM

## 2016-02-16 DIAGNOSIS — Z0000 Encounter for general adult medical examination without abnormal findings: Secondary | ICD-10-CM

## 2016-02-16 DIAGNOSIS — E559 Vitamin D deficiency, unspecified: Secondary | ICD-10-CM

## 2016-02-16 MED ORDER — CYANOCOBALAMIN 1000 MCG/ML IJ SOLN
1000.0000 ug | Freq: Once | INTRAMUSCULAR | Status: AC
  Administered 2016-02-16: 1000 ug via INTRAMUSCULAR

## 2016-02-16 NOTE — Assessment & Plan Note (Signed)
Recheck today, then b12 shot.

## 2016-02-16 NOTE — Progress Notes (Signed)
Pre visit review using our clinic review tool, if applicable. No additional management support is needed unless otherwise documented below in the visit note. 

## 2016-02-16 NOTE — Assessment & Plan Note (Signed)
Controlled on low dose topamax ([facility]).

## 2016-02-16 NOTE — Assessment & Plan Note (Addendum)
Not regular with vitamins. Check vitamin levels today

## 2016-02-16 NOTE — Addendum Note (Signed)
Addended by: Josph MachoANCE, KIMBERLY A on: 02/16/2016 04:05 PM   Modules accepted: Orders

## 2016-02-16 NOTE — Assessment & Plan Note (Signed)
Discussed healthy diet and lifestyle changes to affect sustainable weight loss  

## 2016-02-16 NOTE — Progress Notes (Signed)
BP 136/88 (BP Location: Right Arm, Cuff Size: Large)   Pulse 60   Temp 98.6 F (37 C) (Oral)   Ht 5\' 9"  (1.753 m)   Wt 265 lb 8 oz (120.4 kg)   BMI 39.21 kg/m    CC: CPE Subjective:    Patient ID: Vincent Diaz, male    DOB: February 17, 1981, 35 y.o.   MRN: [id number]  HPI: Tanner R Diaz is a 35 y.o. male presenting on 02/16/2016 for Annual Exam   Last seen here 04/2013. Started on topamax for migraine prevention Not regular with vitamins after bariatric surgery  Preventative: Fasting labs today ([facility]).  Tetanus - unsure, thinks around 2011 Flu shot - yearly Seat belt use discussed Sunscreen use discussed. No changing moles on skin Non smoker Alcohol - none  Lives with wife and 2 children (2, 5, 5, 8)  Occupation: Armed forces technical officerTest System Analyst at [employer]: Masters in [employer]  Activity: coaches youth football (8yo), some working out Diet: some water, fruits/vegetables some   Relevant past medical, surgical, family and social history reviewed and updated as indicated. Interim medical history since our last visit reviewed. Allergies and medications reviewed and updated. Current Outpatient Prescriptions on File Prior to Visit  Medication Sig  . ibuprofen (ADVIL,MOTRIN) 200 MG tablet Take 200 mg by mouth every 6 (six) hours as needed for headache (pain).  Marland Kitchen. omeprazole (PRILOSEC) 20 MG capsule Take 1 capsule (20 mg total) by mouth daily.   No current facility-administered medications on file prior to visit.     Review of Systems  Constitutional: Negative for activity change, appetite change, chills, fatigue, fever and unexpected weight change.  HENT: Negative for hearing loss.   Eyes: Negative for visual disturbance.  Respiratory: Negative for cough, chest tightness, shortness of breath and wheezing.   Cardiovascular: Negative for chest pain, palpitations and leg swelling.  Gastrointestinal: Negative for abdominal distention, abdominal pain, blood in stool, constipation,  diarrhea, nausea and vomiting.  Genitourinary: Negative for difficulty urinating and hematuria.  Musculoskeletal: Negative for arthralgias, myalgias and neck pain.  Skin: Negative for rash.  Neurological: Positive for headaches (h/o migraines). Negative for dizziness, seizures and syncope.  Hematological: Negative for adenopathy. Does not bruise/bleed easily.  Psychiatric/Behavioral: Negative for dysphoric mood. The patient is not nervous/anxious.    Per HPI unless specifically indicated in ROS section     Objective:    BP 136/88 (BP Location: Right Arm, Cuff Size: Large)   Pulse 60   Temp 98.6 F (37 C) (Oral)   Ht 5\' 9"  (1.753 m)   Wt 265 lb 8 oz (120.4 kg)   BMI 39.21 kg/m   Wt Readings from Last 3 Encounters:  02/16/16 265 lb 8 oz (120.4 kg)  01/03/16 265 lb (120.2 kg)  05/01/15 265 lb (120.2 kg)   nadir 235lbs (2015) Physical Exam  Constitutional: He is oriented to person, place, and time. He appears well-developed and well-nourished. No distress.  HENT:  Head: Normocephalic and atraumatic.  Right Ear: Hearing, tympanic membrane, external ear and ear canal normal.  Left Ear: Hearing, tympanic membrane, external ear and ear canal normal.  Nose: Nose normal.  Mouth/Throat: Uvula is midline, oropharynx is clear and moist and mucous membranes are normal. No oropharyngeal exudate, posterior oropharyngeal edema or posterior oropharyngeal erythema.  Eyes: Conjunctivae and EOM are normal. Pupils are equal, round, and reactive to light. No scleral icterus.  Neck: Normal range of motion. Neck supple. No thyromegaly present.  Cardiovascular: Normal rate, regular rhythm,  normal heart sounds and intact distal pulses.   No murmur heard. Pulses:      Radial pulses are 2+ on the right side, and 2+ on the left side.  Pulmonary/Chest: Effort normal and breath sounds normal. No respiratory distress. He has no wheezes. He has no rales.  Abdominal: Soft. Bowel sounds are normal. He exhibits  no distension and no mass. There is no tenderness. There is no rebound and no guarding.  Musculoskeletal: Normal range of motion. He exhibits no edema.  Lymphadenopathy:    He has no cervical adenopathy.  Neurological: He is alert and oriented to person, place, and time.  CN grossly intact, station and gait intact  Skin: Skin is warm and dry. No rash noted.  Psychiatric: He has a normal mood and affect. His behavior is normal. Judgment and thought content normal.  Nursing note and vitals reviewed.      Assessment & Plan:   Problem List Items Addressed This Visit    Healthcare maintenance - Primary    Preventative protocols reviewed and updated unless pt declined. Discussed healthy diet and lifestyle.       Migraine    Controlled on low dose topamax ([facility]).       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      Relevant Orders   Lipid panel   Comprehensive metabolic panel   TSH   S/P gastric bypass    Not regular with vitamins. Check vitamin levels today      Relevant Orders   Vitamin B12   Ferritin   IBC panel   Folate   VITAMIN D 25 Hydroxy (Vit-D Deficiency, Fractures)   Vitamin B1   Lipid panel   Comprehensive metabolic panel   TSH   CBC with Differential/Platelet   Vitamin B12 deficiency    Recheck today, then b12 shot.       Relevant Orders   Vitamin B12   Vitamin D deficiency    Recheck today       Relevant Orders   VITAMIN D 25 Hydroxy (Vit-D Deficiency, Fractures)    Other Visit Diagnoses    Need for influenza vaccination       Relevant Orders   Flu Vaccine QUAD 36+ mos PF IM (Fluarix & Fluzone Quad PF) (Completed)       Follow up plan: Return in about 1 year (around 02/15/2017), or as needed, for annual exam, prior fasting for blood work.  [name], MD

## 2016-02-16 NOTE — Patient Instructions (Addendum)
Flu shot Fasting labs today B12 shot today.  Work on staying active and healthy diet for goal weight loss.  Think about MyPlate.  Return as needed or in 1 year for next physical.  Health Maintenance, Male A healthy lifestyle and preventative care can promote health and wellness.  Maintain regular health, dental, and eye exams.  Eat a healthy diet. Foods like vegetables, fruits, whole grains, low-fat dairy products, and lean protein foods contain the nutrients you need and are low in calories. Decrease your intake of foods high in solid fats, added sugars, and salt. Get information about a proper diet from your health care provider, if necessary.  Regular physical exercise is one of the most important things you can do for your health. Most adults should get at least 150 minutes of moderate-intensity exercise (any activity that increases your heart rate and causes you to sweat) each week. In addition, most adults need muscle-strengthening exercises on 2 or more days a week.   Maintain a healthy weight. The body mass index (BMI) is a screening tool to identify possible weight problems. It provides an estimate of body fat based on height and weight. Your health care provider can find your BMI and can help you achieve or maintain a healthy weight. For males 20 years and older:  A BMI below 18.5 is considered underweight.  A BMI of 18.5 to 24.9 is normal.  A BMI of 25 to 29.9 is considered overweight.  A BMI of 30 and above is considered obese.  Maintain normal blood lipids and cholesterol by exercising and minimizing your intake of saturated fat. Eat a balanced diet with plenty of fruits and vegetables. Blood tests for lipids and cholesterol should begin at age 35 and be repeated every 5 years. If your lipid or cholesterol levels are high, you are over age 35, or you are at high risk for heart disease, you may need your cholesterol levels checked more frequently.Ongoing high lipid and  cholesterol levels should be treated with medicines if diet and exercise are not working.  If you smoke, find out from your health care provider how to quit. If you do not use tobacco, do not start.  Lung cancer screening is recommended for adults aged 55-80 years who are at high risk for developing lung cancer because of a history of smoking. A yearly low-dose CT scan of the lungs is recommended for people who have at least a 30-pack-year history of smoking and are current smokers or have quit within the past 15 years. A pack year of smoking is smoking an average of 1 pack of cigarettes a day for 1 year (for example, a 30-pack-year history of smoking could mean smoking 1 pack a day for 30 years or 2 packs a day for 15 years). Yearly screening should continue until the smoker has stopped smoking for at least 15 years. Yearly screening should be stopped for people who develop a health problem that would prevent them from having lung cancer treatment.  If you choose to drink alcohol, do not have more than 2 drinks per day. One drink is considered to be 12 oz (360 mL) of beer, 5 oz (150 mL) of wine, or 1.5 oz (45 mL) of liquor.  Avoid the use of street drugs. Do not share needles with anyone. Ask for help if you need support or instructions about stopping the use of drugs.  High blood pressure causes heart disease and increases the risk of stroke. High blood pressure  is more likely to develop in:  People who have blood pressure in the end of the normal range (100-139/85-89 mm Hg).  People who are overweight or obese.  People who are African American.  If you are 13-63 years of age, have your blood pressure checked every 3-5 years. If you are 85 years of age or older, have your blood pressure checked every year. You should have your blood pressure measured twice--once when you are at a hospital or clinic, and once when you are not at a hospital or clinic. Record the average of the two measurements. To  check your blood pressure when you are not at a hospital or clinic, you can use:  An automated blood pressure machine at a pharmacy.  A home blood pressure monitor.  If you are 24-60 years old, ask your health care provider if you should take aspirin to prevent heart disease.  Diabetes screening involves taking a blood sample to check your fasting blood sugar level. This should be done once every 3 years after age 89 if you are at a normal weight and without risk factors for diabetes. Testing should be considered at a younger age or be carried out more frequently if you are overweight and have at least 1 risk factor for diabetes.  Colorectal cancer can be detected and often prevented. Most routine colorectal cancer screening begins at the age of 47 and continues through age 63. However, your health care provider may recommend screening at an earlier age if you have risk factors for colon cancer. On a yearly basis, your health care provider may provide home test kits to check for hidden blood in the stool. A small camera at the end of a tube may be used to directly examine the colon (sigmoidoscopy or colonoscopy) to detect the earliest forms of colorectal cancer. Talk to your health care provider about this at age 56 when routine screening begins. A direct exam of the colon should be repeated every 5-10 years through age 63, unless early forms of precancerous polyps or small growths are found.  People who are at an increased risk for hepatitis B should be screened for this virus. You are considered at high risk for hepatitis B if:  You were born in a country where hepatitis B occurs often. Talk with your health care provider about which countries are considered high risk.  Your parents were born in a high-risk country and you have not received a shot to protect against hepatitis B (hepatitis B vaccine).  You have HIV or AIDS.  You use needles to inject street drugs.  You live with, or have sex  with, someone who has hepatitis B.  You are a man who has sex with other men (MSM).  You get hemodialysis treatment.  You take certain medicines for conditions like cancer, organ transplantation, and autoimmune conditions.  Hepatitis C blood testing is recommended for all people born from [birth year] through 1965 and any individual with known risk factors for hepatitis C.  Healthy men should no longer receive prostate-specific antigen (PSA) blood tests as part of routine cancer screening. Talk to your health care provider about prostate cancer screening.  Testicular cancer screening is not recommended for adolescents or adult males who have no symptoms. Screening includes self-exam, a health care provider exam, and other screening tests. Consult with your health care provider about any symptoms you have or any concerns you have about testicular cancer.  Practice safe sex. Use condoms and avoid high-risk  sexual practices to reduce the spread of sexually transmitted infections (STIs).  You should be screened for STIs, including gonorrhea and chlamydia if:  You are sexually active and are younger than 24 years.  You are older than 24 years, and your health care provider tells you that you are at risk for this type of infection.  Your sexual activity has changed since you were last screened, and you are at an increased risk for chlamydia or gonorrhea. Ask your health care provider if you are at risk.  If you are at risk of being infected with HIV, it is recommended that you take a prescription medicine daily to prevent HIV infection. This is called pre-exposure prophylaxis (PrEP). You are considered at risk if:  You are a man who has sex with other men (MSM).  You are a heterosexual man who is sexually active with multiple partners.  You take drugs by injection.  You are sexually active with a partner who has HIV.  Talk with your health care provider about whether you are at high risk of being  infected with HIV. If you choose to begin PrEP, you should first be tested for HIV. You should then be tested every 3 months for as long as you are taking PrEP.  Use sunscreen. Apply sunscreen liberally and repeatedly throughout the day. You should seek shade when your shadow is shorter than you. Protect yourself by wearing long sleeves, pants, a wide-brimmed hat, and sunglasses year round whenever you are outdoors.  Tell your health care provider of new moles or changes in moles, especially if there is a change in shape or color. Also, tell your health care provider if a mole is larger than the size of a pencil eraser.  A one-time screening for abdominal aortic aneurysm (AAA) and surgical repair of large AAAs by ultrasound is recommended for men aged 26-75 years who are current or former smokers.  Stay current with your vaccines (immunizations).   This information is not intended to replace advice given to you by your health care provider. Make sure you discuss any questions you have with your health care provider.   Document Released: 10/27/2007 Document Revised: 05/21/2014 Document Reviewed: 09/25/2010 Elsevier Interactive Patient Education [employer].

## 2016-02-16 NOTE — Assessment & Plan Note (Signed)
Preventative protocols reviewed and updated unless pt declined. Discussed healthy diet and lifestyle.  

## 2016-02-16 NOTE — Assessment & Plan Note (Signed)
Recheck today. 

## 2016-02-16 NOTE — Addendum Note (Signed)
Addended by: Baldomero LamyHAVERS, Marquett Bertoli C on: 02/16/2016 03:58 PM   Modules accepted: Orders

## 2016-02-17 ENCOUNTER — Encounter (HOSPITAL_COMMUNITY): Payer: Self-pay

## 2016-02-21 LAB — SPECIMEN STATUS REPORT

## 2016-02-22 LAB — COMPREHENSIVE METABOLIC PANEL
ALT: 14 IU/L (ref 0–44)
AST: 20 IU/L (ref 0–40)
Albumin/Globulin Ratio: 1.8 (ref 1.2–2.2)
Albumin: 4.1 g/dL (ref 3.5–5.5)
Alkaline Phosphatase: 79 IU/L (ref 39–117)
BILIRUBIN TOTAL: 0.3 mg/dL (ref 0.0–1.2)
BUN/Creatinine Ratio: 16 (ref 9–20)
BUN: 16 mg/dL (ref 6–20)
CHLORIDE: 104 mmol/L (ref 96–106)
CO2: 22 mmol/L (ref 18–29)
Calcium: 8.8 mg/dL (ref 8.7–10.2)
Creatinine, Ser: 1 mg/dL (ref 0.76–1.27)
GFR calc non Af Amer: 98 mL/min/{1.73_m2} (ref >59)
GFR, EST AFRICAN AMERICAN: 113 mL/min/{1.73_m2} (ref >59)
GLUCOSE: 87 mg/dL (ref 65–99)
Globulin, Total: 2.3 g/dL (ref 1.5–4.5)
Potassium: 4.2 mmol/L (ref 3.5–5.2)
Sodium: 141 mmol/L (ref 134–144)
TOTAL PROTEIN: 6.4 g/dL (ref 6.0–8.5)

## 2016-02-22 LAB — LIPID PANEL
CHOL/HDL RATIO: 2.7 ratio (ref 0.0–5.0)
Cholesterol, Total: 141 mg/dL (ref 100–199)
HDL: 52 mg/dL (ref >39)
LDL Calculated: 82 mg/dL (ref 0–99)
Triglycerides: 35 mg/dL (ref 0–149)
VLDL Cholesterol Cal: 7 mg/dL (ref 5–40)

## 2016-02-22 LAB — CBC WITH DIFFERENTIAL/PLATELET
BASOS ABS: 0 10*3/uL (ref 0.0–0.2)
BASOS: 0 %
EOS (ABSOLUTE): 0.1 10*3/uL (ref 0.0–0.4)
Eos: 1 %
Hematocrit: 36.2 % — ABNORMAL LOW (ref 37.5–51.0)
Hemoglobin: 11.7 g/dL — ABNORMAL LOW (ref 12.6–17.7)
IMMATURE GRANS (ABS): 0 10*3/uL (ref 0.0–0.1)
IMMATURE GRANULOCYTES: 0 %
LYMPHS: 23 %
Lymphocytes Absolute: 1.5 10*3/uL (ref 0.7–3.1)
MCH: 26.4 pg — ABNORMAL LOW (ref 26.6–33.0)
MCHC: 32.3 g/dL (ref 31.5–35.7)
MCV: 82 fL (ref 79–97)
Monocytes Absolute: 0.7 10*3/uL (ref 0.1–0.9)
Monocytes: 11 %
NEUTROS PCT: 65 %
Neutrophils Absolute: 4.1 10*3/uL (ref 1.4–7.0)
PLATELETS: 281 10*3/uL (ref 150–379)
RBC: 4.43 x10E6/uL (ref 4.14–5.80)
RDW: 14.8 % (ref 12.3–15.4)
WBC: 6.4 10*3/uL (ref 3.4–10.8)

## 2016-02-22 LAB — FERRITIN: Ferritin: 34 ng/mL (ref 30–400)

## 2016-02-22 LAB — VITAMIN D 25 HYDROXY (VIT D DEFICIENCY, FRACTURES): VIT D 25 HYDROXY: 10.6 ng/mL — AB (ref 30.0–100.0)

## 2016-02-22 LAB — IRON AND TIBC
IRON SATURATION: 27 % (ref 15–55)
IRON: 66 ug/dL (ref 38–169)
TIBC: 243 ug/dL — AB (ref 250–450)
UIBC: 177 ug/dL (ref 111–343)

## 2016-02-22 LAB — TSH: TSH: 2.25 u[IU]/mL (ref 0.450–4.500)

## 2016-02-22 LAB — VITAMIN B12: VITAMIN B 12: 152 pg/mL — AB (ref 211–946)

## 2016-02-22 LAB — FOLATE: FOLATE: 10 ng/mL (ref >3.0)

## 2016-02-22 LAB — VITAMIN B1

## 2016-02-22 LAB — SPECIMEN STATUS REPORT

## 2016-02-23 ENCOUNTER — Telehealth: Payer: Self-pay | Admitting: *Deleted

## 2016-02-23 NOTE — Telephone Encounter (Signed)
[facility] called to say that the sample for the B1 arrived not frozen, Redraw requested.

## 2016-02-23 NOTE — Telephone Encounter (Signed)
Ok to cancel B1.  plz ask [facility] to notify us right away whenever an error like this, not wait 1 wk.

## 2016-02-25 ENCOUNTER — Other Ambulatory Visit: Payer: Self-pay | Admitting: Family Medicine

## 2016-02-25 DIAGNOSIS — D508 Other iron deficiency anemias: Secondary | ICD-10-CM

## 2016-02-25 DIAGNOSIS — D539 Nutritional anemia, unspecified: Secondary | ICD-10-CM | POA: Insufficient documentation

## 2016-02-25 MED ORDER — VITAMIN D 50 MCG (2000 UT) PO CAPS: 1.0000 | ORAL_CAPSULE | Freq: Every day | ORAL | Status: DC

## 2016-02-25 MED ORDER — FERROUS SULFATE 325 (65 FE) MG PO TABS: 325.0000 mg | ORAL_TABLET | ORAL | Status: DC

## 2016-03-20 ENCOUNTER — Ambulatory Visit (INDEPENDENT_AMBULATORY_CARE_PROVIDER_SITE_OTHER): Payer: 59 | Admitting: *Deleted

## 2016-03-20 DIAGNOSIS — E538 Deficiency of other specified B group vitamins: Secondary | ICD-10-CM | POA: Diagnosis not present

## 2016-03-20 MED ORDER — CYANOCOBALAMIN 1000 MCG/ML IJ SOLN
1000.0000 ug | INTRAMUSCULAR | Status: AC
  Administered 2016-03-20: 1000 ug via INTRAMUSCULAR

## 2016-04-24 ENCOUNTER — Ambulatory Visit (INDEPENDENT_AMBULATORY_CARE_PROVIDER_SITE_OTHER): Payer: 59 | Admitting: *Deleted

## 2016-04-24 DIAGNOSIS — E538 Deficiency of other specified B group vitamins: Secondary | ICD-10-CM | POA: Diagnosis not present

## 2016-04-24 MED ORDER — CYANOCOBALAMIN 1000 MCG/ML IJ SOLN
1000.0000 ug | Freq: Once | INTRAMUSCULAR | Status: AC
  Administered 2016-04-24: 1000 ug via INTRAMUSCULAR

## 2016-05-25 ENCOUNTER — Encounter: Payer: Self-pay | Admitting: Family Medicine

## 2016-05-25 ENCOUNTER — Ambulatory Visit (INDEPENDENT_AMBULATORY_CARE_PROVIDER_SITE_OTHER): Payer: 59 | Admitting: Family Medicine

## 2016-05-25 VITALS — BP 122/70 | HR 100 | Temp 98.4°F | Wt 296.8 lb

## 2016-05-25 DIAGNOSIS — E559 Vitamin D deficiency, unspecified: Secondary | ICD-10-CM | POA: Diagnosis not present

## 2016-05-25 DIAGNOSIS — Z9884 Bariatric surgery status: Secondary | ICD-10-CM | POA: Diagnosis not present

## 2016-05-25 DIAGNOSIS — E538 Deficiency of other specified B group vitamins: Secondary | ICD-10-CM | POA: Diagnosis not present

## 2016-05-25 DIAGNOSIS — Z8042 Family history of malignant neoplasm of prostate: Secondary | ICD-10-CM

## 2016-05-25 MED ORDER — CYANOCOBALAMIN 1000 MCG/ML IJ SOLN
1000.0000 ug | Freq: Once | INTRAMUSCULAR | Status: AC
  Administered 2016-05-25: 1000 ug via INTRAMUSCULAR

## 2016-05-25 NOTE — Assessment & Plan Note (Signed)
Reviewed history, recommend start checking age 36yo

## 2016-05-25 NOTE — Assessment & Plan Note (Signed)
Discussed concern over noted weight gain today - 30lbs in 3 months. Will reweight prior to leaving. Reviewed recommended healthy diet changes and activity for sustainable weight loss. RTC 1 mo f/u visit.

## 2016-05-25 NOTE — Assessment & Plan Note (Signed)
b12 shot today. Continue monthly shots

## 2016-05-25 NOTE — Addendum Note (Signed)
Addended by: Josph MachoANCE, KIMBERLY A on: 05/25/2016 02:54 PM   Modules accepted: Orders

## 2016-05-25 NOTE — Progress Notes (Signed)
Pre visit review using our clinic review tool, if applicable. No additional management support is needed unless otherwise documented below in the visit note. 

## 2016-05-25 NOTE — Assessment & Plan Note (Signed)
Encouraged he be more regular with vitamins.

## 2016-05-25 NOTE — Progress Notes (Addendum)
BP 122/70   Pulse 100   Temp 98.4 F (36.9 C) (Oral)   Wt 296 lb 12 oz (134.6 kg)   BMI 43.82 kg/m    CC: discuss prostate cancer screening Subjective:    Patient ID: Vincent Diaz, male    DOB: 12-18-1980, 36 y.o.   MRN: [id number]  HPI: Vincent Diaz is a 36 y.o. male presenting on 05/25/2016 for Follow-up   Patient concerned about prostate cancer. Strong family history of prostate cancer. 3-4 maternal uncles have had prostate cancer, earliest diagnosis was 36yo. Pt denies significant prostate symptoms - hesitancy, dribbling, incomplete emptying. Normally wakes up 1-2 times nightly to void.   Our scales show 30 lb weight gain over last 3 months. This is despite patient endorsing diet changes (starting in the new year) - decreased sugars, low carb, started eating plan he was using prior to his bypass surgery. Planning to use work gym (treadmill) 30 minutes several times a week.   Compliant with b12 shot monthly.   H/o Roux - en - Y 2010 ([facility]). Nadir weight 227lbs (summer 2016)  Relevant past medical, surgical, family and social history reviewed and updated as indicated. Interim medical history since our last visit reviewed. Allergies and medications reviewed and updated. Current Outpatient Prescriptions on File Prior to Visit  Medication Sig  . cyanocobalamin (,VITAMIN B-12,) 1000 MCG/ML injection Inject 1 mL (1,000 mcg total) into the muscle every 30 (thirty) days.  Marland Kitchen. topiramate (TOPAMAX) 25 MG tablet Take 25 mg by mouth daily.  . Cholecalciferol (VITAMIN D) 2000 units CAPS Take 1 capsule (2,000 Units total) by mouth daily. (Patient not taking: Reported on 05/25/2016)  . ferrous sulfate 325 (65 FE) MG tablet Take 1 tablet (325 mg total) by mouth every Monday, Wednesday, and Friday. (Patient not taking: Reported on 05/25/2016)  . ibuprofen (ADVIL,MOTRIN) 200 MG tablet Take 200 mg by mouth every 6 (six) hours as needed for headache (pain).   Current  Facility-Administered Medications on File Prior to Visit  Medication  . cyanocobalamin ((VITAMIN B-12)) injection 1,000 mcg    Review of Systems Per HPI unless specifically indicated in ROS section     Objective:    BP 122/70   Pulse 100   Temp 98.4 F (36.9 C) (Oral)   Wt 296 lb 12 oz (134.6 kg)   BMI 43.82 kg/m   Wt Readings from Last 3 Encounters:  05/25/16 296 lb 12 oz (134.6 kg)  02/16/16 265 lb 8 oz (120.4 kg)  01/03/16 265 lb (120.2 kg)    Physical Exam  Constitutional: He appears well-developed and well-nourished. No distress.  Psychiatric: He has a normal mood and affect.  Nursing note and vitals reviewed.  Results for orders placed or performed in visit on 02/16/16  Vitamin B12  Result Value Ref Range   Vitamin B-12 152 (L) 211 - 946 pg/mL  Ferritin  Result Value Ref Range   Ferritin 34 30 - 400 ng/mL  Folate  Result Value Ref Range   Folate 10.0 >3.0 ng/mL  VITAMIN D 25 Hydroxy (Vit-D Deficiency, Fractures)  Result Value Ref Range   Vit D, 25-Hydroxy 10.6 (L) 30.0 - 100.0 ng/mL  Vitamin B1  Result Value Ref Range   Thiamine CANCELED nmol/L  Lipid panel  Result Value Ref Range   Cholesterol, Total 141 100 - 199 mg/dL   Triglycerides 35 0 - 149 mg/dL   HDL 52 >40>39 mg/dL   VLDL Cholesterol Cal 7 5 - 40 mg/dL  LDL Calculated 82 0 - 99 mg/dL   Chol/HDL Ratio 2.7 0.0 - 5.0 ratio units  Comprehensive metabolic panel  Result Value Ref Range   Glucose 87 65 - 99 mg/dL   BUN 16 6 - 20 mg/dL   Creatinine, Ser 1.61 0.76 - 1.27 mg/dL   GFR calc non Af Amer 98 >59 mL/min/1.73   GFR calc Af Amer 113 >59 mL/min/1.73   BUN/Creatinine Ratio 16 9 - 20   Sodium 141 134 - 144 mmol/L   Potassium 4.2 3.5 - 5.2 mmol/L   Chloride 104 96 - 106 mmol/L   CO2 22 18 - 29 mmol/L   Calcium 8.8 8.7 - 10.2 mg/dL   Total Protein 6.4 6.0 - 8.5 g/dL   Albumin 4.1 3.5 - 5.5 g/dL   Globulin, Total 2.3 1.5 - 4.5 g/dL   Albumin/Globulin Ratio 1.8 1.2 - 2.2   Bilirubin Total 0.3  0.0 - 1.2 mg/dL   Alkaline Phosphatase 79 39 - 117 IU/L   AST 20 0 - 40 IU/L   ALT 14 0 - 44 IU/L  TSH  Result Value Ref Range   TSH 2.250 0.450 - 4.500 uIU/mL  CBC with Differential/Platelet  Result Value Ref Range   WBC 6.4 3.4 - 10.8 x10E3/uL   RBC 4.43 4.14 - 5.80 x10E6/uL   Hemoglobin 11.7 (L) 12.6 - 17.7 g/dL   Hematocrit 09.6 (L) 04.5 - 51.0 %   MCV 82 79 - 97 fL   MCH 26.4 (L) 26.6 - 33.0 pg   MCHC 32.3 31.5 - 35.7 g/dL   RDW 40.9 81.1 - 91.4 %   Platelets 281 150 - 379 x10E3/uL   Neutrophils 65 Not Estab. %   Lymphs 23 Not Estab. %   Monocytes 11 Not Estab. %   Eos 1 Not Estab. %   Basos 0 Not Estab. %   Neutrophils Absolute 4.1 1.4 - 7.0 x10E3/uL   Lymphocytes Absolute 1.5 0.7 - 3.1 x10E3/uL   Monocytes Absolute 0.7 0.1 - 0.9 x10E3/uL   EOS (ABSOLUTE) 0.1 0.0 - 0.4 x10E3/uL   Basophils Absolute 0.0 0.0 - 0.2 x10E3/uL   Immature Granulocytes 0 Not Estab. %   Immature Grans (Abs) 0.0 0.0 - 0.1 x10E3/uL  Iron and TIBC  Result Value Ref Range   Total Iron Binding Capacity 243 (L) 250 - 450 ug/dL   UIBC 782 956 - 213 ug/dL   Iron 66 38 - 086 ug/dL   Iron Saturation 27 15 - 55 %  Specimen status report  Result Value Ref Range   specimen status report Comment   Specimen status report  Result Value Ref Range   specimen status report Comment       Assessment & Plan:   Problem List Items Addressed This Visit    Family history of prostate cancer    Reviewed history, recommend start checking age 62yo       S/P gastric bypass    Encouraged he be more regular with vitamins.       Severe obesity (BMI >= 40) (HCC)    Discussed concern over noted weight gain today - 30lbs in 3 months. Will reweight prior to leaving. Reviewed recommended healthy diet changes and activity for sustainable weight loss. RTC 1 mo f/u visit.       Vitamin B12 deficiency - Primary    b12 shot today. Continue monthly shots       Vitamin D deficiency       Follow up  plan: Return in  about 4 weeks (around 06/22/2016) for follow up visit.  [name], MD

## 2016-05-25 NOTE — Patient Instructions (Addendum)
b12 shot today. Recheck weight.  Return in 1 month for follow up visit.

## 2016-06-26 ENCOUNTER — Encounter: Payer: Self-pay | Admitting: Family Medicine

## 2016-06-26 ENCOUNTER — Ambulatory Visit (INDEPENDENT_AMBULATORY_CARE_PROVIDER_SITE_OTHER): Payer: 59 | Admitting: Family Medicine

## 2016-06-26 DIAGNOSIS — E538 Deficiency of other specified B group vitamins: Secondary | ICD-10-CM

## 2016-06-26 DIAGNOSIS — E559 Vitamin D deficiency, unspecified: Secondary | ICD-10-CM | POA: Diagnosis not present

## 2016-06-26 DIAGNOSIS — G43009 Migraine without aura, not intractable, without status migrainosus: Secondary | ICD-10-CM

## 2016-06-26 DIAGNOSIS — D539 Nutritional anemia, unspecified: Secondary | ICD-10-CM

## 2016-06-26 DIAGNOSIS — Z9884 Bariatric surgery status: Secondary | ICD-10-CM

## 2016-06-26 MED ORDER — TOPIRAMATE 50 MG PO TABS: 50.0000 mg | ORAL_TABLET | Freq: Every day | ORAL | 11 refills | Status: DC

## 2016-06-26 MED ORDER — CYANOCOBALAMIN 1000 MCG/ML IJ SOLN
1000.0000 ug | Freq: Once | INTRAMUSCULAR | Status: AC
  Administered 2016-06-26: 1000 ug via INTRAMUSCULAR

## 2016-06-26 NOTE — Progress Notes (Addendum)
BP 124/82   Pulse 68   Temp 98.6 F (37 C) (Oral)   Wt (!) 301 lb 4 oz (136.6 kg)   BMI 44.49 kg/m    CC: f/u visit obesity Subjective:    Patient ID: Vincent Diaz, male    DOB: 26-Jan-1981, 36 y.o.   MRN: [id number]  HPI: Vincent Diaz is a 36 y.o. male presenting on 06/26/2016 for Follow-up   Ongoing weight gain noted.   Changed diet - choosing low carb protein shakes in place of breakfast or lunch. Eating 3 oz meat, 1/2-1 cup vegetables, no carbs with meals. Avoiding juices and sweetened beverages.  Changed exercise regimen - aerobics and weight lifting at work 3x/wk for 30 min. This week wants to start insanity.   Vit B12 and D deficiency - reports compliance with b12 shots monthly as well as 2000 IU vit D daily.   Increasing migraines - despite topamax 25mg  daily. This was started by neurologist at [facility]. Has not followed up.   Relevant past medical, surgical, family and social history reviewed and updated as indicated. Interim medical history since our last visit reviewed. Allergies and medications reviewed and updated. Current Outpatient Prescriptions on File Prior to Visit  Medication Sig  . cyanocobalamin (,VITAMIN B-12,) 1000 MCG/ML injection Inject 1 mL (1,000 mcg total) into the muscle every 30 (thirty) days.  . ferrous sulfate 325 (65 FE) MG tablet Take 1 tablet (325 mg total) by mouth every Monday, Wednesday, and Friday.  Marland Kitchen ibuprofen (ADVIL,MOTRIN) 200 MG tablet Take 200 mg by mouth every 6 (six) hours as needed for headache (pain).  Marland Kitchen sertraline (ZOLOFT) 100 MG tablet Take 100 mg by mouth daily.  . Cholecalciferol (VITAMIN D) 2000 units CAPS Take 1 capsule (2,000 Units total) by mouth daily. (Patient not taking: Reported on 05/25/2016)   Current Facility-Administered Medications on File Prior to Visit  Medication  . cyanocobalamin ((VITAMIN B-12)) injection 1,000 mcg    Review of Systems Per HPI unless specifically indicated in ROS section    Objective:    BP 124/82   Pulse 68   Temp 98.6 F (37 C) (Oral)   Wt (!) 301 lb 4 oz (136.6 kg)   BMI 44.49 kg/m   Wt Readings from Last 3 Encounters:  06/26/16 (!) 301 lb 4 oz (136.6 kg)  05/25/16 296 lb 12 oz (134.6 kg)  02/16/16 265 lb 8 oz (120.4 kg)    Physical Exam  Constitutional: He appears well-developed and well-nourished. No distress.  HENT:  Mouth/Throat: Oropharynx is clear and moist. No oropharyngeal exudate.  Cardiovascular: Normal rate, regular rhythm, normal heart sounds and intact distal pulses.   No murmur heard. Pulmonary/Chest: Effort normal and breath sounds normal. No respiratory distress. He has no wheezes. He has no rales.  Musculoskeletal: He exhibits no edema.  Skin: Skin is warm and dry. No rash noted.  Psychiatric: He has a normal mood and affect.  Nursing note and vitals reviewed.  Results for orders placed or performed in visit on 02/16/16  Vitamin B12  Result Value Ref Range   Vitamin B-12 152 (L) 211 - 946 pg/mL  Ferritin  Result Value Ref Range   Ferritin 34 30 - 400 ng/mL  Folate  Result Value Ref Range   Folate 10.0 >3.0 ng/mL  VITAMIN D 25 Hydroxy (Vit-D Deficiency, Fractures)  Result Value Ref Range   Vit D, 25-Hydroxy 10.6 (L) 30.0 - 100.0 ng/mL  Vitamin B1  Result Value Ref Range  Thiamine CANCELED nmol/L  Lipid panel  Result Value Ref Range   Cholesterol, Total 141 100 - 199 mg/dL   Triglycerides 35 0 - 149 mg/dL   HDL 52 >16>39 mg/dL   VLDL Cholesterol Cal 7 5 - 40 mg/dL   LDL Calculated 82 0 - 99 mg/dL   Chol/HDL Ratio 2.7 0.0 - 5.0 ratio units  Comprehensive metabolic panel  Result Value Ref Range   Glucose 87 65 - 99 mg/dL   BUN 16 6 - 20 mg/dL   Creatinine, Ser 1.091.00 0.76 - 1.27 mg/dL   GFR calc non Af Amer 98 >59 mL/min/1.73   GFR calc Af Amer 113 >59 mL/min/1.73   BUN/Creatinine Ratio 16 9 - 20   Sodium 141 134 - 144 mmol/L   Potassium 4.2 3.5 - 5.2 mmol/L   Chloride 104 96 - 106 mmol/L   CO2 22 18 - 29 mmol/L    Calcium 8.8 8.7 - 10.2 mg/dL   Total Protein 6.4 6.0 - 8.5 g/dL   Albumin 4.1 3.5 - 5.5 g/dL   Globulin, Total 2.3 1.5 - 4.5 g/dL   Albumin/Globulin Ratio 1.8 1.2 - 2.2   Bilirubin Total 0.3 0.0 - 1.2 mg/dL   Alkaline Phosphatase 79 39 - 117 IU/L   AST 20 0 - 40 IU/L   ALT 14 0 - 44 IU/L  TSH  Result Value Ref Range   TSH 2.250 0.450 - 4.500 uIU/mL  CBC with Differential/Platelet  Result Value Ref Range   WBC 6.4 3.4 - 10.8 x10E3/uL   RBC 4.43 4.14 - 5.80 x10E6/uL   Hemoglobin 11.7 (L) 12.6 - 17.7 g/dL   Hematocrit 60.436.2 (L) 54.037.5 - 51.0 %   MCV 82 79 - 97 fL   MCH 26.4 (L) 26.6 - 33.0 pg   MCHC 32.3 31.5 - 35.7 g/dL   RDW 98.114.8 19.112.3 - 47.815.4 %   Platelets 281 150 - 379 x10E3/uL   Neutrophils 65 Not Estab. %   Lymphs 23 Not Estab. %   Monocytes 11 Not Estab. %   Eos 1 Not Estab. %   Basos 0 Not Estab. %   Neutrophils Absolute 4.1 1.4 - 7.0 x10E3/uL   Lymphocytes Absolute 1.5 0.7 - 3.1 x10E3/uL   Monocytes Absolute 0.7 0.1 - 0.9 x10E3/uL   EOS (ABSOLUTE) 0.1 0.0 - 0.4 x10E3/uL   Basophils Absolute 0.0 0.0 - 0.2 x10E3/uL   Immature Granulocytes 0 Not Estab. %   Immature Grans (Abs) 0.0 0.0 - 0.1 x10E3/uL  Iron and TIBC  Result Value Ref Range   Total Iron Binding Capacity 243 (L) 250 - 450 ug/dL   UIBC 295177 621111 - 308343 ug/dL   Iron 66 38 - 657169 ug/dL   Iron Saturation 27 15 - 55 %  Specimen status report  Result Value Ref Range   specimen status report Comment   Specimen status report  Result Value Ref Range   specimen status report Comment       Assessment & Plan:   Problem List Items Addressed This Visit    Migraine    Deterior[facility] - will increase topamax to 50mg  nightly       Relevant Medications   topiramate (TOPAMAX) 50 MG tablet   Nutritional anemia   Relevant Medications   cyanocobalamin ((VITAMIN B-12)) injection 1,000 mcg (Completed)   Other Relevant Orders   CBC with Differential/Platelet   S/P gastric bypass     Weight gain continues.  Check vitamin levels.       Relevant Orders   Amb ref to Medical Nutrition Therapy-[facility]   Severe obesity (BMI >= 40) (HCC) - Primary    5lb weight gain noted in the past month despite healthy diet and lifestyle changes. Encouraged continued healthy changes, will continue to monitor this. Will also increase topamax to 50mg  nightly. F/u 2 mo.  Discussed nutritionist referral - pt agrees.       Relevant Orders   Amb ref to Medical Nutrition Therapy-[facility]   Vitamin B12 deficiency    Check b12 level then we will administer b12 shot.       Relevant Medications   cyanocobalamin ((VITAMIN B-12)) injection 1,000 mcg (Completed)   Other Relevant Orders   Vitamin B12   Vitamin D deficiency    Taking 2000 IU vit d QOD. rec daily dosing. Check vit D levels today.       Relevant Orders   VITAMIN D 25 Hydroxy (Vit-D Deficiency, Fractures)       Follow up plan: Return in about 2 months (around 08/24/2016) for follow up visit.  [name], MD

## 2016-06-26 NOTE — Assessment & Plan Note (Signed)
Check b12 level then we will administer b12 shot.

## 2016-06-26 NOTE — Assessment & Plan Note (Signed)
Weight gain continues. Check vitamin levels.

## 2016-06-26 NOTE — Addendum Note (Signed)
Addended by: Eustaquio BoydenGUTIERREZ, Stephenson Cichy on: 06/26/2016 04:25 PM   Modules accepted: Orders

## 2016-06-26 NOTE — Assessment & Plan Note (Signed)
Deteriorated [facility] - will increase topamax to 50mg  nightly

## 2016-06-26 NOTE — Addendum Note (Signed)
Addended by: Alvina ChouWALSH, TERRI J on: 06/26/2016 04:15 PM   Modules accepted: Orders

## 2016-06-26 NOTE — Patient Instructions (Addendum)
Labs then B12 shot today.  Take vitamin D 2000 units daily.  Increase topamax to 50mg  at bedtime.  We will refer you to nutritionist as well.  Return in 2 months for follow up visit.

## 2016-06-26 NOTE — Assessment & Plan Note (Signed)
Taking 2000 IU vit d QOD. rec daily dosing. Check vit D levels today.

## 2016-06-26 NOTE — Progress Notes (Signed)
Pre visit review using our clinic review tool, if applicable. No additional management support is needed unless otherwise documented below in the visit note. 

## 2016-06-26 NOTE — Addendum Note (Signed)
Addended by: Josph MachoANCE, KIMBERLY A on: 06/26/2016 04:12 PM   Modules accepted: Orders

## 2016-06-26 NOTE — Assessment & Plan Note (Addendum)
5lb weight gain noted in the past month despite healthy diet and lifestyle changes. Encouraged continued healthy changes, will continue to monitor this. Will also increase topamax to 50mg  nightly. F/u 2 mo.  Discussed nutritionist referral - pt agrees.

## 2016-06-28 LAB — CBC WITH DIFFERENTIAL/PLATELET
BASOS ABS: 0 10*3/uL (ref 0.0–0.2)
BASOS: 0 %
EOS (ABSOLUTE): 0.1 10*3/uL (ref 0.0–0.4)
Eos: 1 %
HEMOGLOBIN: 12.1 g/dL — AB (ref 13.0–17.7)
Hematocrit: 38.7 % (ref 37.5–51.0)
IMMATURE GRANS (ABS): 0 10*3/uL (ref 0.0–0.1)
IMMATURE GRANULOCYTES: 0 %
LYMPHS: 25 %
Lymphocytes Absolute: 1.8 10*3/uL (ref 0.7–3.1)
MCH: 25.9 pg — ABNORMAL LOW (ref 26.6–33.0)
MCHC: 31.3 g/dL — ABNORMAL LOW (ref 31.5–35.7)
MCV: 83 fL (ref 79–97)
Monocytes Absolute: 0.8 10*3/uL (ref 0.1–0.9)
Monocytes: 12 %
NEUTROS ABS: 4.5 10*3/uL (ref 1.4–7.0)
NEUTROS PCT: 62 %
PLATELETS: 272 10*3/uL (ref 150–379)
RBC: 4.67 x10E6/uL (ref 4.14–5.80)
RDW: 14.7 % (ref 12.3–15.4)
WBC: 7.2 10*3/uL (ref 3.4–10.8)

## 2016-06-28 LAB — VITAMIN D 25 HYDROXY (VIT D DEFICIENCY, FRACTURES): VIT D 25 HYDROXY: 11.7 ng/mL — AB (ref 30.0–100.0)

## 2016-06-28 LAB — VITAMIN B12: VITAMIN B 12: 425 pg/mL (ref 232–1245)

## 2016-07-01 ENCOUNTER — Other Ambulatory Visit: Payer: Self-pay | Admitting: Family Medicine

## 2016-07-01 MED ORDER — VITAMIN D3 1.25 MG (50000 UT) PO TABS: 1.0000 | ORAL_TABLET | ORAL | 1 refills | Status: DC

## 2016-07-25 ENCOUNTER — Ambulatory Visit (INDEPENDENT_AMBULATORY_CARE_PROVIDER_SITE_OTHER): Payer: 59

## 2016-07-25 DIAGNOSIS — E538 Deficiency of other specified B group vitamins: Secondary | ICD-10-CM

## 2016-07-25 MED ORDER — CYANOCOBALAMIN 1000 MCG/ML IJ SOLN
1000.0000 ug | Freq: Once | INTRAMUSCULAR | Status: AC
  Administered 2016-07-25: 1000 ug via INTRAMUSCULAR

## 2016-08-28 ENCOUNTER — Ambulatory Visit: Payer: 59 | Admitting: Family Medicine

## 2016-09-06 ENCOUNTER — Encounter: Payer: Self-pay | Admitting: Skilled Nursing Facility1

## 2016-09-06 ENCOUNTER — Encounter: Payer: 59 | Attending: Family Medicine | Admitting: Skilled Nursing Facility1

## 2016-09-06 DIAGNOSIS — E669 Obesity, unspecified: Secondary | ICD-10-CM | POA: Insufficient documentation

## 2016-09-06 DIAGNOSIS — Z6841 Body Mass Index (BMI) 40.0 and over, adult: Secondary | ICD-10-CM | POA: Diagnosis not present

## 2016-09-06 DIAGNOSIS — Z9884 Bariatric surgery status: Secondary | ICD-10-CM | POA: Diagnosis not present

## 2016-09-06 DIAGNOSIS — Z713 Dietary counseling and surveillance: Secondary | ICD-10-CM | POA: Diagnosis not present

## 2016-09-06 NOTE — Progress Notes (Signed)
  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 Pt states his highest wt before surgery was 398 pounds stating he is currently at his highest wt after surgery, pt states his lowest wt was 235 pounds maintaining that for about 1.5 years until 2015. Pt states he started gaining weight when his wife was pregnant. Pt does not report any GI issues or manifestations of deficiencies. Pt states he was not taking supplements because they are not necessary.   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   Nutritional Diagnosis:  [place]-3.3 Overweight/obesity related to past poor dietary habits and physical inactivity as evidenced by patient w/ recent RYGB surgery following dietary guidelines for continued weight loss.    Intervention:  Nutrition counseling.Dietitian educated the pt on the need for a flexible diet and the need for supplements.Dietitian also advised he keep in touch with his surgeon for follow-up. Goals: -Look into baritastic app to remind you to take your supplements  -Take your multivitamin and your calcium 2 hours from one another: any calcium will do -you can do your vegetables any  way you want  Teaching Method Utilized:  Visual Auditory Hands on  Handouts given during visit include:  Low sodium  Barriers to learning/adherence to lifestyle change: seemingly not understanding the need for dietary/supplement specificity   Demonstrated degree of understanding via:  Teach Back   Monitoring/Evaluation:  Dietary intake, exercise, vitamin/mineral labs, and body weight prn.

## 2016-09-06 NOTE — Patient Instructions (Signed)
-  Look into baritastic app to remind you to take your supplements   -Take your multivitamin and your calcium 2 hours from one another: any calcium will do  -you can do your vegetables any way you want

## 2016-10-31 ENCOUNTER — Other Ambulatory Visit: Payer: Self-pay | Admitting: Family Medicine

## 2016-10-31 ENCOUNTER — Other Ambulatory Visit: Payer: Self-pay | Admitting: *Deleted

## 2016-10-31 MED ORDER — TOPIRAMATE 50 MG PO TABS: 50.0000 mg | ORAL_TABLET | Freq: Every day | ORAL | 2 refills | Status: DC

## 2016-11-01 MED ORDER — VITAMIN D 50 MCG (2000 UT) PO CAPS: 1.0000 | ORAL_CAPSULE | Freq: Every day | ORAL | Status: DC

## 2016-11-01 MED ORDER — FERROUS SULFATE 325 (65 FE) MG PO TABS: 325.0000 mg | ORAL_TABLET | ORAL | Status: DC

## 2016-11-01 NOTE — Telephone Encounter (Signed)
Faxed over [facility]  On 6/20

## 2016-11-09 ENCOUNTER — Other Ambulatory Visit: Payer: Self-pay | Admitting: Family Medicine

## 2016-11-09 NOTE — Telephone Encounter (Signed)
Notified patient that he is to get the Ferrous Sulfate 325 over the counter. Per Dr. Timoteo ExposeG's response to R/X request on 11/01/16.    Patient verbalizes understanding.

## 2016-11-20 ENCOUNTER — Ambulatory Visit: Payer: 59 | Admitting: Family Medicine

## 2016-11-30 ENCOUNTER — Encounter (HOSPITAL_COMMUNITY): Payer: Self-pay

## 2016-12-26 ENCOUNTER — Encounter: Payer: Self-pay | Admitting: Family Medicine

## 2016-12-26 ENCOUNTER — Ambulatory Visit (INDEPENDENT_AMBULATORY_CARE_PROVIDER_SITE_OTHER): Payer: 59 | Admitting: Family Medicine

## 2016-12-26 VITALS — BP 134/78 | HR 49 | Temp 98.2°F | Ht 69.0 in | Wt 296.0 lb

## 2016-12-26 DIAGNOSIS — Z9884 Bariatric surgery status: Secondary | ICD-10-CM | POA: Diagnosis not present

## 2016-12-26 DIAGNOSIS — E559 Vitamin D deficiency, unspecified: Secondary | ICD-10-CM | POA: Diagnosis not present

## 2016-12-26 DIAGNOSIS — Z0000 Encounter for general adult medical examination without abnormal findings: Secondary | ICD-10-CM | POA: Diagnosis not present

## 2016-12-26 DIAGNOSIS — E538 Deficiency of other specified B group vitamins: Secondary | ICD-10-CM | POA: Diagnosis not present

## 2016-12-26 DIAGNOSIS — F524 Premature ejaculation: Secondary | ICD-10-CM | POA: Diagnosis not present

## 2016-12-26 DIAGNOSIS — D539 Nutritional anemia, unspecified: Secondary | ICD-10-CM | POA: Diagnosis not present

## 2016-12-26 DIAGNOSIS — G43009 Migraine without aura, not intractable, without status migrainosus: Secondary | ICD-10-CM | POA: Diagnosis not present

## 2016-12-26 MED ORDER — CYANOCOBALAMIN 1000 MCG/ML IJ SOLN
1000.0000 ug | Freq: Once | INTRAMUSCULAR | Status: AC
  Administered 2016-12-26: 1000 ug via INTRAMUSCULAR

## 2016-12-26 MED ORDER — SERTRALINE HCL 100 MG PO TABS: 100.0000 mg | ORAL_TABLET | Freq: Every day | ORAL | 6 refills | Status: DC

## 2016-12-26 NOTE — Assessment & Plan Note (Signed)
Congratulated on healthy diet and lifestyle changes with 10 lb weight loss noted in the last several months. Encouraged ongoing healthy sustainable changes. He is motivated to continue this.

## 2016-12-26 NOTE — Progress Notes (Signed)
BP 134/78   Pulse (!) 49   Temp 98.2 F (36.8 C) (Oral)   Ht 5\' 9"  (1.753 m)   Wt 296 lb (134.3 kg)   SpO2 98%   BMI 43.71 kg/m    CC: CPE Subjective:    Patient ID: Vincent Diaz, male    DOB: [birth year], 36 y.o.   MRN: [id number]  HPI: Vincent Diaz is a 36 y.o. male presenting on 12/26/2016 for Annual Exam   He thinks he is allowed one physical per calendar year with his insurance. Last CPE 02/2016. Fasting today.   Obesity - last visit we referred to nutritionist and recommended B12 shots - he has not been regular with b12 shots. Weight down 10 lbs - works out during lunch breaks - 30 min cardio (eliptical, treadmill), 30 min weight training. Healthy diet changes - low to no carbs, sugar free drinks.   Regular with vit D 50,000 IU weekly.   Off sertraline - prescribed for PE issues by urology. Desires to restart this medicine, previously tolerated it well.   Preventative: Tdap 2011 Flu shot yearly Seat belt use discussed Sunscreen use discussed. No changing moles on skin Non smoker  Alcohol - none  Agrees to HIV screen today.  Lives with wife and 2 children (2, 5, 5, 8)  Occupation: Armed forces technical officer at [employer]: Masters in [employer]  Activity: coaches youth football (8yo), some working out Diet: some water, fruits/vegetables some   Relevant past medical, surgical, family and social history reviewed and updated as indicated. Interim medical history since our last visit reviewed. Allergies and medications reviewed and updated. Outpatient Medications Prior to Visit  Medication Sig Dispense Refill  . Cholecalciferol (VITAMIN D) 2000 units CAPS Take 1 capsule (2,000 Units total) by mouth daily. 30 capsule   . Cholecalciferol (VITAMIN D3) 50000 units TABS Take 1 tablet by mouth once a week. 12 tablet 1  . cyanocobalamin (,VITAMIN B-12,) 1000 MCG/ML injection Inject 1 mL (1,000 mcg total) into the muscle every 30 (thirty) days. 1 mL 0  . ferrous sulfate 325 (65  FE) MG tablet Take 1 tablet (325 mg total) by mouth every Monday, Wednesday, and Friday.    Marland Kitchen ibuprofen (ADVIL,MOTRIN) 200 MG tablet Take 200 mg by mouth every 6 (six) hours as needed for headache (pain).    . topiramate (TOPAMAX) 50 MG tablet Take 1 tablet (50 mg total) by mouth daily. 90 tablet 2  . sertraline (ZOLOFT) 100 MG tablet Take 100 mg by mouth daily.     Facility-Administered Medications Prior to Visit  Medication Dose Route Frequency Provider Last Rate Last Dose  . cyanocobalamin ((VITAMIN B-12)) injection 1,000 mcg  1,000 mcg Intramuscular Q30 days [name], MD   1,000 mcg at 03/20/16 1457     Per HPI unless specifically indicated in ROS section below Review of Systems     Objective:    BP 134/78   Pulse (!) 49   Temp 98.2 F (36.8 C) (Oral)   Ht 5\' 9"  (1.753 m)   Wt 296 lb (134.3 kg)   SpO2 98%   BMI 43.71 kg/m   Wt Readings from Last 3 Encounters:  12/26/16 296 lb (134.3 kg)  09/06/16 (!) 306 lb 6.4 oz (139 kg)  06/26/16 (!) 301 lb 4 oz (136.6 kg)    Physical Exam  Constitutional: He is oriented to person, place, and time. He appears well-developed and well-nourished. No distress.  HENT:  Head: Normocephalic and atraumatic.  Right Ear: Hearing, tympanic membrane, external ear and ear canal normal.  Left Ear: Hearing, tympanic membrane, external ear and ear canal normal.  Nose: Nose normal.  Mouth/Throat: Uvula is midline, oropharynx is clear and moist and mucous membranes are normal. No oropharyngeal exudate, posterior oropharyngeal edema or posterior oropharyngeal erythema.  Eyes: Pupils are equal, round, and reactive to light. Conjunctivae and EOM are normal. No scleral icterus.  Neck: Normal range of motion. Neck supple. No thyromegaly present.  Cardiovascular: Normal rate, regular rhythm, normal heart sounds and intact distal pulses.   No murmur heard. Pulses:      Radial pulses are 2+ on the right side, and 2+ on the left side.    Pulmonary/Chest: Effort normal and breath sounds normal. No respiratory distress. He has no wheezes. He has no rales.  Abdominal: Soft. Bowel sounds are normal. He exhibits no distension and no mass. There is no tenderness. There is no rebound and no guarding.  Musculoskeletal: Normal range of motion. He exhibits no edema.  Lymphadenopathy:    He has no cervical adenopathy.  Neurological: He is alert and oriented to person, place, and time.  CN grossly intact, station and gait intact  Skin: Skin is warm and dry. No rash noted.  Psychiatric: He has a normal mood and affect. His behavior is normal. Judgment and thought content normal.  Nursing note and vitals reviewed.  Results for orders placed or performed in visit on 06/26/16  Vitamin B12  Result Value Ref Range   Vitamin B-12 425 232 - 1,245 pg/mL  CBC with Differential/Platelet  Result Value Ref Range   WBC 7.2 3.4 - 10.8 x10E3/uL   RBC 4.67 4.14 - 5.80 x10E6/uL   Hemoglobin 12.1 (L) 13.0 - 17.7 g/dL   Hematocrit 69.638.7 29.537.5 - 51.0 %   MCV 83 79 - 97 fL   MCH 25.9 (L) 26.6 - 33.0 pg   MCHC 31.3 (L) 31.5 - 35.7 g/dL   RDW 28.414.7 13.212.3 - 44.015.4 %   Platelets 272 150 - 379 x10E3/uL   Neutrophils 62 Not Estab. %   Lymphs 25 Not Estab. %   Monocytes 12 Not Estab. %   Eos 1 Not Estab. %   Basos 0 Not Estab. %   Neutrophils Absolute 4.5 1.4 - 7.0 x10E3/uL   Lymphocytes Absolute 1.8 0.7 - 3.1 x10E3/uL   Monocytes Absolute 0.8 0.1 - 0.9 x10E3/uL   EOS (ABSOLUTE) 0.1 0.0 - 0.4 x10E3/uL   Basophils Absolute 0.0 0.0 - 0.2 x10E3/uL   Immature Granulocytes 0 Not Estab. %   Immature Grans (Abs) 0.0 0.0 - 0.1 x10E3/uL  VITAMIN D 25 Hydroxy (Vit-D Deficiency, Fractures)  Result Value Ref Range   Vit D, 25-Hydroxy 11.7 (L) 30.0 - 100.0 ng/mL      Assessment & Plan:  Will finish biometric screening form when labs return.  Problem List Items Addressed This Visit    Healthcare maintenance - Primary    Preventative protocols reviewed and  updated unless pt declined. Discussed healthy diet and lifestyle.       Migraine    Continue topamax 50mg  daily.       Relevant Medications   sertraline (ZOLOFT) 100 MG tablet   Nutritional anemia    Update labs      Relevant Orders   CBC with Differential/Platelet   Ferritin   Premature ejaculation    Sertraline 100mg  daily has been effective - refill.       S/P gastric bypass  Update vitamin levels.       Relevant Orders   Ferritin   Severe obesity (BMI >= 40) (HCC)    Congratulated on healthy diet and lifestyle changes with 10 lb weight loss noted in the last several months. Encouraged ongoing healthy sustainable changes. He is motivated to continue this.       Relevant Orders   Lipid panel   Hemoglobin A1c   Basic metabolic panel   Vitamin B12 deficiency    Update labs today then rpt B12 shot today.       Relevant Orders   Vitamin B12   Vitamin D deficiency    Update vit D after several months of weekly 50,000 IU .      Relevant Orders   VITAMIN D 25 Hydroxy (Vit-D Deficiency, Fractures)       Follow up plan: Return in about 6 months (around 06/28/2017).  [name], MD

## 2016-12-26 NOTE — Assessment & Plan Note (Signed)
Update vit D after several months of weekly 50,000 IU .

## 2016-12-26 NOTE — Assessment & Plan Note (Signed)
Update labs.  

## 2016-12-26 NOTE — Assessment & Plan Note (Signed)
Update labs today then rpt B12 shot today.

## 2016-12-26 NOTE — Assessment & Plan Note (Signed)
Update vitamin levels.  

## 2016-12-26 NOTE — Addendum Note (Signed)
Addended by: Eual FinesBRIDGES, SHANNON P on: 12/26/2016 05:34 PM   Modules accepted: Orders

## 2016-12-26 NOTE — Assessment & Plan Note (Signed)
Sertraline 100mg  daily has been effective - refill.

## 2016-12-26 NOTE — Patient Instructions (Addendum)
Labs today. Then B12 shot.  Congratulations on healthy diet and lifestyle changes! Return as needed or in 6 months for follow up visit.   Health Maintenance, Male A healthy lifestyle and preventive care is important for your health and wellness. Ask your health care provider about what schedule of regular examinations is right for you. What should I know about weight and diet? Eat a Healthy Diet  Eat plenty of vegetables, fruits, whole grains, low-fat dairy products, and lean protein.  Do not eat a lot of foods high in solid fats, added sugars, or salt.  Maintain a Healthy Weight Regular exercise can help you achieve or maintain a healthy weight. You should:  Do at least 150 minutes of exercise each week. The exercise should increase your heart rate and make you sweat (moderate-intensity exercise).  Do strength-training exercises at least twice a week.  Watch Your Levels of Cholesterol and Blood Lipids  Have your blood tested for lipids and cholesterol every 5 years starting at 36 years of age. If you are at high risk for heart disease, you should start having your blood tested when you are 36 years old. You may need to have your cholesterol levels checked more often if: ? Your lipid or cholesterol levels are high. ? You are older than 36 years of age. ? You are at high risk for heart disease.  What should I know about cancer screening? Many types of cancers can be detected early and may often be prevented. Lung Cancer  You should be screened every year for lung cancer if: ? You are a current smoker who has smoked for at least 30 years. ? You are a former smoker who has quit within the past 15 years.  Talk to your health care provider about your screening options, when you should start screening, and how often you should be screened.  Colorectal Cancer  Routine colorectal cancer screening usually begins at 36 years of age and should be repeated every 5-10 years until you are 36  years old. You may need to be screened more often if early forms of precancerous polyps or small growths are found. Your health care provider may recommend screening at an earlier age if you have risk factors for colon cancer.  Your health care provider may recommend using home test kits to check for hidden blood in the stool.  A small camera at the end of a tube can be used to examine your colon (sigmoidoscopy or colonoscopy). This checks for the earliest forms of colorectal cancer.  Prostate and Testicular Cancer  Depending on your age and overall health, your health care provider may do certain tests to screen for prostate and testicular cancer.  Talk to your health care provider about any symptoms or concerns you have about testicular or prostate cancer.  Skin Cancer  Check your skin from head to toe regularly.  Tell your health care provider about any new moles or changes in moles, especially if: ? There is a change in a mole's size, shape, or color. ? You have a mole that is larger than a pencil eraser.  Always use sunscreen. Apply sunscreen liberally and repeat throughout the day.  Protect yourself by wearing long sleeves, pants, a wide-brimmed hat, and sunglasses when outside.  What should I know about heart disease, diabetes, and high blood pressure?  If you are 4918-36 years of age, have your blood pressure checked every 3-5 years. If you are 36 years of age or older,  have your blood pressure checked every year. You should have your blood pressure measured twice-once when you are at a hospital or clinic, and once when you are not at a hospital or clinic. Record the average of the two measurements. To check your blood pressure when you are not at a hospital or clinic, you can use: ? An automated blood pressure machine at a pharmacy. ? A home blood pressure monitor.  Talk to your health care provider about your target blood pressure.  If you are between 48-48 years old, ask your  health care provider if you should take aspirin to prevent heart disease.  Have regular diabetes screenings by checking your fasting blood sugar level. ? If you are at a normal weight and have a low risk for diabetes, have this test once every three years after the age of 14. ? If you are overweight and have a high risk for diabetes, consider being tested at a younger age or more often.  A one-time screening for abdominal aortic aneurysm (AAA) by ultrasound is recommended for men aged 13-75 years who are current or former smokers. What should I know about preventing infection? Hepatitis B If you have a higher risk for hepatitis B, you should be screened for this virus. Talk with your health care provider to find out if you are at risk for hepatitis B infection. Hepatitis C Blood testing is recommended for:  Everyone born from [birth year] through 1965.  Anyone with known risk factors for hepatitis C.  Sexually Transmitted Diseases (STDs)  You should be screened each year for STDs including gonorrhea and chlamydia if: ? You are sexually active and are younger than 36 years of age. ? You are older than 36 years of age and your health care provider tells you that you are at risk for this type of infection. ? Your sexual activity has changed since you were last screened and you are at an increased risk for chlamydia or gonorrhea. Ask your health care provider if you are at risk.  Talk with your health care provider about whether you are at high risk of being infected with HIV. Your health care provider may recommend a prescription medicine to help prevent HIV infection.  What else can I do?  Schedule regular health, dental, and eye exams.  Stay current with your vaccines (immunizations).  Do not use any tobacco products, such as cigarettes, chewing tobacco, and e-cigarettes. If you need help quitting, ask your health care provider.  Limit alcohol intake to no more than 2 drinks per day. One  drink equals 12 ounces of beer, 5 ounces of wine, or 1 ounces of hard liquor.  Do not use street drugs.  Do not share needles.  Ask your health care provider for help if you need support or information about quitting drugs.  Tell your health care provider if you often feel depressed.  Tell your health care provider if you have ever been abused or do not feel safe at home. This information is not intended to replace advice given to you by your health care provider. Make sure you discuss any questions you have with your health care provider. Document Released: 10/27/2007 Document Revised: 12/28/2015 Document Reviewed: 02/01/2015 Elsevier Interactive Patient Education  [employer].

## 2016-12-26 NOTE — Assessment & Plan Note (Signed)
Preventative protocols reviewed and updated unless pt declined. Discussed healthy diet and lifestyle.  

## 2016-12-26 NOTE — Assessment & Plan Note (Signed)
Continue topamax 50mg  daily.

## 2016-12-28 LAB — BASIC METABOLIC PANEL
BUN/Creatinine Ratio: 18 (ref 9–20)
BUN: 19 mg/dL (ref 6–20)
CALCIUM: 9.1 mg/dL (ref 8.7–10.2)
CHLORIDE: 108 mmol/L — AB (ref 96–106)
CO2: 20 mmol/L (ref 20–29)
Creatinine, Ser: 1.08 mg/dL (ref 0.76–1.27)
GFR calc Af Amer: 102 mL/min/{1.73_m2} (ref >59)
GFR calc non Af Amer: 88 mL/min/{1.73_m2} (ref >59)
GLUCOSE: 95 mg/dL (ref 65–99)
POTASSIUM: 4 mmol/L (ref 3.5–5.2)
Sodium: 143 mmol/L (ref 134–144)

## 2016-12-28 LAB — LIPID PANEL
CHOLESTEROL TOTAL: 148 mg/dL (ref 100–199)
Chol/HDL Ratio: 3.5 ratio (ref 0.0–5.0)
HDL: 42 mg/dL (ref >39)
LDL Calculated: 97 mg/dL (ref 0–99)
Triglycerides: 43 mg/dL (ref 0–149)
VLDL CHOLESTEROL CAL: 9 mg/dL (ref 5–40)

## 2016-12-28 LAB — HEMOGLOBIN A1C
ESTIMATED AVERAGE GLUCOSE: 117 mg/dL
HEMOGLOBIN A1C: 5.7 % — AB (ref 4.8–5.6)

## 2016-12-28 LAB — CBC WITH DIFFERENTIAL/PLATELET
BASOS ABS: 0 10*3/uL (ref 0.0–0.2)
Basos: 0 %
EOS (ABSOLUTE): 0.1 10*3/uL (ref 0.0–0.4)
Eos: 1 %
HEMOGLOBIN: 12.6 g/dL — AB (ref 13.0–17.7)
Hematocrit: 38.9 % (ref 37.5–51.0)
IMMATURE GRANULOCYTES: 0 %
Immature Grans (Abs): 0 10*3/uL (ref 0.0–0.1)
Lymphocytes Absolute: 2 10*3/uL (ref 0.7–3.1)
Lymphs: 30 %
MCH: 26.5 pg — AB (ref 26.6–33.0)
MCHC: 32.4 g/dL (ref 31.5–35.7)
MCV: 82 fL (ref 79–97)
MONOCYTES: 6 %
Monocytes Absolute: 0.4 10*3/uL (ref 0.1–0.9)
NEUTROS ABS: 4.2 10*3/uL (ref 1.4–7.0)
NEUTROS PCT: 63 %
PLATELETS: 287 10*3/uL (ref 150–379)
RBC: 4.76 x10E6/uL (ref 4.14–5.80)
RDW: 15.1 % (ref 12.3–15.4)
WBC: 6.7 10*3/uL (ref 3.4–10.8)

## 2016-12-28 LAB — VITAMIN D 25 HYDROXY (VIT D DEFICIENCY, FRACTURES): Vit D, 25-Hydroxy: 28.8 ng/mL — ABNORMAL LOW (ref 30.0–100.0)

## 2016-12-28 LAB — VITAMIN B12: VITAMIN B 12: 336 pg/mL (ref 232–1245)

## 2016-12-28 LAB — FERRITIN: FERRITIN: 31 ng/mL (ref 30–400)

## 2016-12-30 ENCOUNTER — Other Ambulatory Visit: Payer: Self-pay | Admitting: Family Medicine

## 2016-12-30 MED ORDER — VITAMIN D3 1.25 MG (50000 UT) PO TABS: 1.0000 | ORAL_TABLET | ORAL | 3 refills | Status: DC

## 2017-01-04 ENCOUNTER — Telehealth: Payer: Self-pay | Admitting: Family Medicine

## 2017-01-04 NOTE — Telephone Encounter (Signed)
Patient had a biometric form filled out for work.  Patient said they need a copy of his lab work.  Patient said he can come by today to pick it up.  Please call patient when it's ready.

## 2017-01-04 NOTE — Telephone Encounter (Signed)
Spoke to pt. He can get his labs from [facility] and [address]

## 2017-01-29 ENCOUNTER — Ambulatory Visit: Payer: 59

## 2017-06-28 ENCOUNTER — Ambulatory Visit: Payer: 59 | Admitting: Family Medicine

## 2018-04-28 ENCOUNTER — Other Ambulatory Visit: Payer: Self-pay | Admitting: Family Medicine

## 2018-04-29 NOTE — Telephone Encounter (Signed)
Electronic refill request Sertraline Last office visit 12/26/16 Last refill 12/26/16 #30/6 Cancelled appointment 06/28/17 (see cancellation note)

## 2018-04-30 NOTE — Telephone Encounter (Signed)
Will fill #30. Needs OV prior to future refills

## 2018-06-25 ENCOUNTER — Telehealth: Payer: Self-pay | Admitting: Family Medicine

## 2018-06-25 NOTE — Telephone Encounter (Signed)
E-scribed refill.  Pls schedule annual CPE. 

## 2018-06-25 NOTE — Telephone Encounter (Signed)
Tried calling pt no answer voicemail cut off before I could leave message

## 2018-06-27 NOTE — Telephone Encounter (Signed)
Left message asking pt to call office  °

## 2018-07-07 ENCOUNTER — Encounter: Payer: Self-pay | Admitting: Family Medicine

## 2018-07-07 NOTE — Telephone Encounter (Signed)
Left message asking pt to call office also mailed letter °

## 2018-07-08 NOTE — Telephone Encounter (Signed)
Noted  

## 2019-04-27 ENCOUNTER — Other Ambulatory Visit: Payer: Self-pay

## 2019-04-27 DIAGNOSIS — Z20822 Contact with and (suspected) exposure to covid-19: Secondary | ICD-10-CM

## 2019-04-28 LAB — NOVEL CORONAVIRUS, NAA: SARS-CoV-2, NAA: NOT DETECTED

## 2019-08-13 ENCOUNTER — Ambulatory Visit: Payer: Self-pay | Attending: Internal Medicine

## 2019-08-13 DIAGNOSIS — Z23 Encounter for immunization: Secondary | ICD-10-CM

## 2019-08-13 NOTE — Progress Notes (Signed)
   Covid-19 Vaccination Clinic  Name:  Ezana R [place]    MRN: [id number] DOB: 05/07/1981  08/13/2019  Mr. [place] was observed post Covid-19 immunization for 15 minutes without incident. He was provided with Vaccine Information Sheet and instruction to access the V-Safe system.   Mr. [place] was instructed to call 911 with any severe reactions post vaccine: Marland Kitchen Difficulty breathing  . Swelling of face and throat  . A fast heartbeat  . A bad rash all over body  . Dizziness and weakness   Immunizations Administered    Name Date Dose VIS Date Route   Pfizer COVID-19 Vaccine 08/13/2019  9:02 AM 0.3 mL 04/24/2019 Intramuscular   Manufacturer: [employer], [employer]   Lot: [id number]   NDC: [address]-1

## 2019-09-07 ENCOUNTER — Ambulatory Visit: Payer: Self-pay | Attending: Internal Medicine

## 2019-09-07 DIAGNOSIS — Z23 Encounter for immunization: Secondary | ICD-10-CM

## 2019-09-07 NOTE — Progress Notes (Signed)
   Covid-19 Vaccination Clinic  Name:  Vincent Diaz    MRN: [id number] DOB: 21-Mar-1981  09/07/2019  [name] was observed post Covid-19 immunization for 15 minutes without incident. He was provided with Vaccine Information Sheet and instruction to access the V-Safe system.   [name] was instructed to call 911 with any severe reactions post vaccine: Marland Kitchen Difficulty breathing  . Swelling of face and throat  . A fast heartbeat  . A bad rash all over body  . Dizziness and weakness   Immunizations Administered    Name Date Dose VIS Date Route   Pfizer COVID-19 Vaccine 09/07/2019  8:54 AM 0.3 mL 07/08/2018 Intramuscular   Manufacturer: [employer], [employer]   Lot: [id number]   NDC: [address]-7

## 2020-07-18 ENCOUNTER — Emergency Department (HOSPITAL_COMMUNITY): Payer: PRIVATE HEALTH INSURANCE

## 2020-07-18 ENCOUNTER — Other Ambulatory Visit: Payer: Self-pay

## 2020-07-18 ENCOUNTER — Inpatient Hospital Stay (HOSPITAL_COMMUNITY)
Admission: EM | Admit: 2020-07-18 | Discharge: 2020-07-20 | DRG: 103 | Disposition: A | Discharge status: Home / Self Care | Payer: PRIVATE HEALTH INSURANCE | Attending: Family Medicine | Admitting: Family Medicine

## 2020-07-18 DIAGNOSIS — E559 Vitamin D deficiency, unspecified: Secondary | ICD-10-CM | POA: Diagnosis present

## 2020-07-18 DIAGNOSIS — M48061 Spinal stenosis, lumbar region without neurogenic claudication: Secondary | ICD-10-CM | POA: Diagnosis present

## 2020-07-18 DIAGNOSIS — Z800 Family history of malignant neoplasm of digestive organs: Secondary | ICD-10-CM

## 2020-07-18 DIAGNOSIS — Z8042 Family history of malignant neoplasm of prostate: Secondary | ICD-10-CM

## 2020-07-18 DIAGNOSIS — M2548 Effusion, other site: Secondary | ICD-10-CM | POA: Diagnosis present

## 2020-07-18 DIAGNOSIS — Z8249 Family history of ischemic heart disease and other diseases of the circulatory system: Secondary | ICD-10-CM

## 2020-07-18 DIAGNOSIS — Z803 Family history of malignant neoplasm of breast: Secondary | ICD-10-CM

## 2020-07-18 DIAGNOSIS — M5410 Radiculopathy, site unspecified: Secondary | ICD-10-CM | POA: Diagnosis present

## 2020-07-18 DIAGNOSIS — M5432 Sciatica, left side: Secondary | ICD-10-CM | POA: Diagnosis present

## 2020-07-18 DIAGNOSIS — G971 Other reaction to spinal and lumbar puncture: Secondary | ICD-10-CM | POA: Diagnosis present

## 2020-07-18 DIAGNOSIS — Z9884 Bariatric surgery status: Secondary | ICD-10-CM

## 2020-07-18 DIAGNOSIS — G43909 Migraine, unspecified, not intractable, without status migrainosus: Secondary | ICD-10-CM | POA: Diagnosis present

## 2020-07-18 DIAGNOSIS — R519 Headache, unspecified: Secondary | ICD-10-CM | POA: Diagnosis present

## 2020-07-18 DIAGNOSIS — E669 Obesity, unspecified: Secondary | ICD-10-CM | POA: Diagnosis present

## 2020-07-18 DIAGNOSIS — G9782 Other postprocedural complications and disorders of nervous system: Secondary | ICD-10-CM

## 2020-07-18 DIAGNOSIS — G932 Benign intracranial hypertension: Secondary | ICD-10-CM | POA: Diagnosis present

## 2020-07-18 DIAGNOSIS — M5431 Sciatica, right side: Secondary | ICD-10-CM | POA: Diagnosis present

## 2020-07-18 DIAGNOSIS — G4733 Obstructive sleep apnea (adult) (pediatric): Secondary | ICD-10-CM | POA: Diagnosis present

## 2020-07-18 DIAGNOSIS — Z6841 Body Mass Index (BMI) 40.0 and over, adult: Secondary | ICD-10-CM

## 2020-07-18 DIAGNOSIS — R55 Syncope and collapse: Secondary | ICD-10-CM | POA: Diagnosis present

## 2020-07-18 DIAGNOSIS — Z813 Family history of other psychoactive substance abuse and dependence: Secondary | ICD-10-CM

## 2020-07-18 DIAGNOSIS — Z79899 Other long term (current) drug therapy: Secondary | ICD-10-CM

## 2020-07-18 DIAGNOSIS — N529 Male erectile dysfunction, unspecified: Secondary | ICD-10-CM | POA: Diagnosis present

## 2020-07-18 DIAGNOSIS — Y844 Aspiration of fluid as the cause of abnormal reaction of the patient, or of later complication, without mention of misadventure at the time of the procedure: Secondary | ICD-10-CM | POA: Diagnosis present

## 2020-07-18 DIAGNOSIS — Z20822 Contact with and (suspected) exposure to covid-19: Secondary | ICD-10-CM | POA: Diagnosis present

## 2020-07-18 LAB — BASIC METABOLIC PANEL
Anion gap: 10 (ref 5–15)
BUN: 13 mg/dL (ref 6–20)
CO2: 21 mmol/L — ABNORMAL LOW (ref 22–32)
Calcium: 8.8 mg/dL — ABNORMAL LOW (ref 8.9–10.3)
Chloride: 110 mmol/L (ref 98–111)
Creatinine, Ser: 1.13 mg/dL (ref 0.61–1.24)
GFR, Estimated: >60 mL/min (ref >60)
Glucose, Bld: 83 mg/dL (ref 70–99)
Potassium: 3.8 mmol/L (ref 3.5–5.1)
Sodium: 141 mmol/L (ref 135–145)

## 2020-07-18 LAB — CBC
HCT: 43.4 % (ref 39.0–52.0)
Hemoglobin: 12.8 g/dL — ABNORMAL LOW (ref 13.0–17.0)
MCH: 25 pg — ABNORMAL LOW (ref 26.0–34.0)
MCHC: 29.5 g/dL — ABNORMAL LOW (ref 30.0–36.0)
MCV: 84.8 fL (ref 80.0–100.0)
Platelets: 259 10*3/uL (ref 150–400)
RBC: 5.12 MIL/uL (ref 4.22–5.81)
RDW: 15.7 % — ABNORMAL HIGH (ref 11.5–15.5)
WBC: 6.6 10*3/uL (ref 4.0–10.5)
nRBC: 0 % (ref 0.0–0.2)

## 2020-07-18 MED ORDER — ONDANSETRON HCL 4 MG/2ML IJ SOLN: 4.0000 mg | Freq: Four times a day (QID) | INTRAMUSCULAR | Status: DC | PRN

## 2020-07-18 MED ORDER — SODIUM CHLORIDE 0.9 % IV SOLN: INTRAVENOUS | Status: AC

## 2020-07-18 MED ORDER — FENTANYL CITRATE (PF) 100 MCG/2ML IJ SOLN
50.0000 ug | Freq: Once | INTRAMUSCULAR | Status: AC
Start: 2020-07-18 — End: 2020-07-18
  Administered 2020-07-18: 50 ug via INTRAVENOUS
  Filled 2020-07-18: qty 2

## 2020-07-18 MED ORDER — GADOBUTROL 1 MMOL/ML IV SOLN
10.0000 mL | Freq: Once | INTRAVENOUS | Status: AC | PRN
  Administered 2020-07-18: 10 mL via INTRAVENOUS

## 2020-07-18 MED ORDER — ONDANSETRON HCL 4 MG PO TABS: 4.0000 mg | ORAL_TABLET | Freq: Four times a day (QID) | ORAL | Status: DC | PRN

## 2020-07-18 MED ORDER — SODIUM CHLORIDE 0.9 % IV BOLUS
1000.0000 mL | Freq: Once | INTRAVENOUS | Status: AC
  Administered 2020-07-18: 1000 mL via INTRAVENOUS

## 2020-07-18 MED ORDER — ACETAMINOPHEN 650 MG RE SUPP: 650.0000 mg | Freq: Four times a day (QID) | RECTAL | Status: DC | PRN

## 2020-07-18 MED ORDER — ACETAMINOPHEN 325 MG PO TABS
650.0000 mg | ORAL_TABLET | Freq: Four times a day (QID) | ORAL | Status: DC | PRN
  Administered 2020-07-18: 650 mg via ORAL

## 2020-07-18 NOTE — ED Provider Notes (Signed)
[facility] [place] EMERGENCY DEPARTMENT Provider Note   CSN: [id number] Arrival date & time: 07/18/20  1243     History No chief complaint on file.   Vincent Diaz is a 40 y.o. male past medical history of rectal dysfunction, headache, with recent spinal tap done on Friday to evaluate intracranial hypertension.  Was done at [facility], no immediate complications.  Patient states the next day he started developing some tingling in his legs which is worsened throughout today.  He also noticed that he had a stiff neck with headache and worsening pain to the puncture site, came here to be evaluated for all of these caused complications.  Denies any fevers, chills, nausea or vomiting.  Per chart review patient did have a CT prior to this, was negative for mass.  CSF opening pressure was normal, no concerns for infection based on what I can see.  Patient states that this morning he fell, is having difficulty standing.  Numbness and tingling did at first resolve when laying, now have persisted.  Patient did originally have a headache the first day, however headache has been worsening throughout the days.  Headache normally resolves when laying, not always present at all times.  In regards to neck pain he states that it stiff, however is able to move it, no fevers.  No confusion.  HPI     Past Medical History:  Diagnosis Date  . ED (erectile dysfunction)   . Headache   . History of chicken pox   . OSA (obstructive sleep apnea) 2012   mild, AHI 5.51 during total sleep time, 13.87 during REM sleep, rec CPAP (Dr. Mayford Knife)  . Premature ejaculation    Dr. Evelene Croon  . 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 Vitamin B12 deficiency   . Vitamin D deficiency     Patient Active Problem List   Diagnosis Date Noted  . Family history of prostate cancer 05/25/2016  . Nutritional anemia 02/25/2016  . OSA (obstructive sleep apnea)   . Healthcare maintenance  04/27/2013  . ED (erectile dysfunction)   . Vitamin D deficiency   . Vitamin B12 deficiency   . Premature ejaculation   . Migraine 01/26/2013  . Severe obesity (BMI >= 40) (HCC) 01/26/2013  . S/P gastric bypass 01/26/2013    Past Surgical History:  Procedure Laterality Date  . GASTRIC BYPASS  2010   Roux en Y ([place])       Family History  Problem Relation Age of Onset  . Hypertension Paternal Grandmother   . Cancer Maternal Aunt        breast  . Drug abuse Mother        h/o  . Cancer Mother        under evaluation for pancreatic cancer  . Cancer Maternal Uncle 51       prostate (age 33-60s), some deceased  . Diabetes Neg Hx   . CAD Neg Hx   . Stroke Neg Hx     Social History   Tobacco Use  . Smoking status: Never Smoker  . Smokeless tobacco: Never Used  Substance Use Topics  . Alcohol use: Yes    Comment: seldom  . Drug use: No    Home Medications Prior to Admission medications   Medication Sig Start Date End Date Taking? Authorizing Provider  Cholecalciferol (VITAMIN D) 2000 units CAPS Take 1 capsule (2,000 Units total) by mouth daily. 11/01/16  [name], MD  Cholecalciferol (VITAMIN D3) 50000 units TABS Take 1 tablet by mouth once a week. 12/30/16   [name], MD  cyanocobalamin (,VITAMIN B-12,) 1000 MCG/ML injection Inject 1 mL (1,000 mcg total) into the muscle every 30 (thirty) days. 02/25/16   [name], MD  ferrous sulfate 325 (65 FE) MG tablet Take 1 tablet (325 mg total) by mouth every Monday, Wednesday, and Friday. 11/02/16   [name], MD  ibuprofen (ADVIL,MOTRIN) 200 MG tablet Take 200 mg by mouth every 6 (six) hours as needed for headache (pain).    [provider]  sertraline (ZOLOFT) 100 MG tablet TAKE 1 TABLET BY MOUTH EVERY DAY 06/25/18   [name], MD  topiramate (TOPAMAX) 50 MG tablet Take 1 tablet (50 mg total) by mouth daily. 10/31/16   [name], MD    Allergies    Patient has no  known allergies.  Review of Systems   Review of Systems  Constitutional: Negative for chills, diaphoresis, fatigue and fever.  HENT: Negative for congestion, sore throat and trouble swallowing.   Eyes: Negative for pain and visual disturbance.  Respiratory: Negative for cough, shortness of breath and wheezing.   Cardiovascular: Negative for chest pain, palpitations and leg swelling.  Gastrointestinal: Negative for abdominal distention, abdominal pain, diarrhea, nausea and vomiting.  Genitourinary: Negative for difficulty urinating.  Musculoskeletal: Positive for arthralgias and gait problem. Negative for back pain, neck pain and neck stiffness.  Skin: Negative for pallor.  Neurological: Positive for numbness and headaches. Negative for dizziness, speech difficulty and weakness.  Psychiatric/Behavioral: Negative for confusion.    Physical Exam Updated Vital Signs BP (!) 153/102 (BP Location: Left Arm)   Pulse 88   Temp 98.2 F (36.8 C) (Oral)   Resp 18   SpO2 100%   Physical Exam Constitutional:      General: He is not in acute distress.    Appearance: Normal appearance. He is not ill-appearing, toxic-appearing or diaphoretic.  HENT:     Head: Normocephalic and atraumatic.      Comments: Patient with pain in area depicted above, not tender to touch.    Mouth/Throat:     Mouth: Mucous membranes are moist.     Pharynx: Oropharynx is clear.  Eyes:     General: No scleral icterus.    Extraocular Movements: Extraocular movements intact.     Pupils: Pupils are equal, round, and reactive to light.  Neck:     Comments: Patient does have some pain when he ranges his neck, however is able to.  No true meningismus.  No tenderness to palpation. Cardiovascular:     Rate and Rhythm: Normal rate and regular rhythm.     Pulses: Normal pulses.     Heart sounds: Normal heart sounds.  Pulmonary:     Effort: Pulmonary effort is normal. No respiratory distress.     Breath sounds: Normal  breath sounds. No stridor. No wheezing, rhonchi or rales.  Chest:     Chest wall: No tenderness.  Abdominal:     General: Abdomen is flat. There is no distension.     Palpations: Abdomen is soft.     Tenderness: There is no abdominal tenderness. There is no guarding or rebound.  Musculoskeletal:        General: No swelling or tenderness. Normal range of motion.     Cervical back: Normal range of motion and neck supple. No rigidity or tenderness.     Right lower leg: No edema.  Left lower leg: No edema.     Comments: Lumbar spine with tenderness to injection site L3 area, no erythema or warmth.  No drainage. 4/5 strength to lower extremity.  Unstable when patient stands.  Skin:    General: Skin is warm and dry.     Capillary Refill: Capillary refill takes less than 2 seconds.     Coloration: Skin is not pale.  Neurological:     General: No focal deficit present.     Mental Status: He is alert and oriented to person, place, and time.     Cranial Nerves: No cranial nerve deficit.     Sensory: No sensory deficit.     Coordination: Coordination normal.  Psychiatric:        Mood and Affect: Mood normal.        Behavior: Behavior normal.     ED Results / Procedures / Treatments   Labs (all labs ordered are listed, but only abnormal results are displayed) Labs Reviewed  CBC - Abnormal; Notable for the following components:      Result Value   Hemoglobin 12.8 (*)    MCH 25.0 (*)    MCHC 29.5 (*)    RDW 15.7 (*)    All other components within normal limits  BASIC METABOLIC PANEL - Abnormal; Notable for the following components:   CO2 21 (*)    Calcium 8.8 (*)    All other components within normal limits    EKG None  Radiology No results found.  Procedures Procedures   Medications Ordered in ED Medications  sodium chloride 0.9 % bolus 1,000 mL (has no administration in time range)  fentaNYL (SUBLIMAZE) injection 50 mcg (has no administration in time range)    ED  Course  I have reviewed the triage vital signs and the nursing notes.  Pertinent labs & imaging results that were available during my care of the patient were reviewed by me and considered in my medical decision making (see chart for details).    MDM Rules/Calculators/A&P                         Jazir R Diaz is a 40 y.o. male past medical history of rectal dysfunction, headache, with recent spinal tap done on Friday to evaluate intracranial hypertension who presents the emerge department today for postop complications. Spoke to Dr. Iver Nestle, neurology who recommends MRI of lumbar spine and brain with and without, they will consult.  Pt care was handed off to G.Green PA-C at 330.  Complete history and physical and current plan have been communicated.  Please refer to their note for the remainder of ED care and ultimate disposition. Awaiting imaging and neuro to come see pt.   This chart was discussed with Dr. Freida Busman who agreed with the care and disposition of the patient.   Final Clinical Impression(s) / ED Diagnoses Final diagnoses:  Postoperative surgical complication involving nervous system associated with nervous system procedure, unspecified complication    Rx / DC Orders ED Discharge Orders    None       Farrel Gordon, PA-C 07/18/20 1512    Lorre Nick, MD 07/20/20 1017

## 2020-07-18 NOTE — H&P (Signed)
History and Physical    Vincent Diaz KXF:[id number] DOB: 03-07-1981 DOA: 07/18/2020  PCP: Patient, No Pcp Per  Patient coming from: Home  I have personally briefly reviewed patient's old medical records in [facility]  Chief Complaint: Headache, paresthesias  HPI: Vincent Diaz is a 40 y.o. male with medical history significant for obesity s/p gastric bypass, migraine headaches who presents to the ED for evaluation of lower extremity paresthesias and headache.  Patient follows with neurology, Dr. Orie Rout, through [facility].  He underwent LP on 07/15/2020 for evaluation of idiopathic intracranial hypertension.  LP performed at L2-L3 level with opening pressure documented as 28 cm H2O and closing pressure 18 cm H2O.  Approximately 26 cc clear, colorless fluid was obtained showing CSF protein 38 and glucose 50, 3 WBC, and 0 RBC.  Patient states he had mild headache cingulate after LP which was as expected.  Over the next couple days he had noticed significantly worsening headaches occurring when he would stand up or sit.  He would feel fine in the morning which he attributed to lying down during the night while asleep.  He has also had some lower back pain with numbness/tingling from his buttocks down towards his feet.  He says he does have chronic lower back pain and sciatica type symptoms which is not new for him.  He has been taking amitriptyline and sumatriptan for his migraines without relief over the last 2 days. Today he had a significantly worsening headache with an episode of dizziness which caused him to fall which prompted him to present to the ED for further evaluation.  He did not lose consciousness, hit his head, or sustain any injury after the fall.  ED Course:  Initial vitals showed BP 153/102, pulse 88, RR 18, temp 98.3 F, SPO2 100% on room air.  Labs show WBC 6.6, hemoglobin 12.8, platelets 259,000, sodium 141, potassium 3.8, bicarb 21, BUN  13, creatinine 1.13, serum glucose 83.  SARS-CoV-2 PCR is ordered and pending.  Neurology were consulted and recommended MRI brain and lumbar spine.  MRI brain was negative for evidence of acute intracranial abnormality.  Nonspecific mild to moderate scattered T2/FLAIR hyperintensities in the white matter noted.  MRI lumbar spine was negative for evidence of discitis/osteomyelitis or epidural abscess.  Severe spinal canal stenosis and mild bilateral neuroforaminal narrowing at L4-L5 seen as well as degenerative changes at L4-5 with bilateral joint effusion and surrounding edema.  Patient was given 1 L normal saline.  Neurology recommended medical admission for observation, keeping supine for 1-2 days with IVF 1 L every 8 hours, and consideration of blood patch pending clinical response.  The hospitalist service was consulted to admit for further evaluation and management.  Review of Systems: All systems reviewed and are negative except as documented in history of present illness above.   Past Medical History:  Diagnosis Date  . ED (erectile dysfunction)   . Headache   . History of chicken pox   . OSA (obstructive sleep apnea) 2012   mild, AHI 5.51 during total sleep time, 13.87 during REM sleep, rec CPAP (Dr. Mayford Knife)  . Premature ejaculation    Dr. Evelene Croon  . 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 Vitamin B12 deficiency   . Vitamin D deficiency     Past Surgical History:  Procedure Laterality Date  . GASTRIC BYPASS  2010   Roux en Y ([place])  Social History:  reports that he has never smoked. He has never used smokeless tobacco. He reports current alcohol use. He reports that he does not use drugs.  No Known Allergies  Family History  Problem Relation Age of Onset  . Hypertension Paternal Grandmother   . Cancer Maternal Aunt        breast  . Drug abuse Mother        h/o  . Cancer Mother        under evaluation for pancreatic cancer  . Cancer Maternal  Uncle 40       prostate (age 49-60s), some deceased  . Diabetes Neg Hx   . CAD Neg Hx   . Stroke Neg Hx      Prior to Admission medications   Medication Sig Start Date End Date Taking? Authorizing Provider  amitriptyline (ELAVIL) 25 MG tablet Take 75 mg by mouth at bedtime. 06/27/20  Yes [provider]  amoxicillin (AMOXIL) 250 MG capsule Take 250 mg by mouth 3 (three) times daily. 07/18/20  Yes [provider]  cyanocobalamin (,VITAMIN B-12,) 1000 MCG/ML injection Inject 1 mL (1,000 mcg total) into the muscle every 30 (thirty) days. 02/25/16  Yes [name], MD  SUMAtriptan (IMITREX) 100 MG tablet Take 100 mg by mouth every 2 (two) hours as needed for migraine or headache. 06/27/20  Yes [provider]  Cholecalciferol (VITAMIN D) 2000 units CAPS Take 1 capsule (2,000 Units total) by mouth daily. Patient not taking: No sig reported 11/01/16   [name], MD  Cholecalciferol (VITAMIN D3) 50000 units TABS Take 1 tablet by mouth once a week. Patient not taking: No sig reported 12/30/16   [name], MD  ferrous sulfate 325 (65 FE) MG tablet Take 1 tablet (325 mg total) by mouth every Monday, Wednesday, and Friday. Patient not taking: No sig reported 11/02/16   [name], MD  sertraline (ZOLOFT) 100 MG tablet TAKE 1 TABLET BY MOUTH EVERY DAY Patient not taking: No sig reported 06/25/18   [name], MD  topiramate (TOPAMAX) 50 MG tablet Take 1 tablet (50 mg total) by mouth daily. Patient not taking: No sig reported 10/31/16   [name], MD    Physical Exam: Vitals:   07/18/20 1345 07/18/20 1400 07/18/20 1415 07/18/20 1530  BP: (!) 141/96 (!) 146/104 (!) 145/98 (!) 164/101  Pulse: 69 71 66 (!) 59  Resp: 16 19 15 15   Temp:      TempSrc:      SpO2: 100% 100% 100% 100%   Constitutional: Resting supine in bed, NAD, calm, comfortable Eyes: PERRL, EOMI, lids and conjunctivae normal ENMT: Mucous membranes are moist. Posterior  pharynx clear of any exudate or lesions.Normal dentition.  Neck: normal, supple, no masses. Respiratory: clear to auscultation bilaterally, no wheezing, no crackles. Normal respiratory effort. No accessory muscle use.  Cardiovascular: Regular rate and rhythm, no murmurs / rubs / gallops. No extremity edema. 2+ pedal pulses. Abdomen: no tenderness, no masses palpated. No hepatosplenomegaly. Bowel sounds positive.  Musculoskeletal: no clubbing / cyanosis. No joint deformity upper and lower extremities. Good ROM, no contractures. Normal muscle tone.  Skin: no rashes, lesions, ulcers. No induration Neurologic: CN 2-12 grossly intact. Sensation intact. Strength 5/5 in all 4.  Psychiatric: Normal judgment and insight. Alert and oriented x 3. Normal mood.   Labs on Admission: I have personally reviewed following labs and imaging studies  CBC: Recent Labs  Lab 07/18/20 1313  WBC 6.6  HGB 12.8*  HCT  43.4  MCV 84.8  PLT 259   Basic Metabolic Panel: Recent Labs  Lab 07/18/20 1313  NA 141  K 3.8  CL 110  CO2 21*  GLUCOSE 83  BUN 13  CREATININE 1.13  CALCIUM 8.8*   GFR: CrCl cannot be calculated (Unknown ideal weight.). Liver Function Tests: No results for input(s): AST, ALT, ALKPHOS, BILITOT, PROT, ALBUMIN in the last 168 hours. No results for input(s): LIPASE, AMYLASE in the last 168 hours. No results for input(s): AMMONIA in the last 168 hours. Coagulation Profile: No results for input(s): INR, PROTIME in the last 168 hours. Cardiac Enzymes: No results for input(s): CKTOTAL, CKMB, CKMBINDEX, TROPONINI in the last 168 hours. BNP (last 3 results) No results for input(s): PROBNP in the last 8760 hours. HbA1C: No results for input(s): HGBA1C in the last 72 hours. CBG: No results for input(s): GLUCAP in the last 168 hours. Lipid Profile: No results for input(s): CHOL, HDL, LDLCALC, TRIG, CHOLHDL, LDLDIRECT in the last 72 hours. Thyroid Function Tests: No results for input(s):  TSH, T4TOTAL, FREET4, T3FREE, THYROIDAB in the last 72 hours. Anemia Panel: No results for input(s): VITAMINB12, FOLATE, FERRITIN, TIBC, IRON, RETICCTPCT in the last 72 hours. Urine analysis: No results found for: COLORURINE, APPEARANCEUR, LABSPEC, PHURINE, GLUCOSEU, HGBUR, BILIRUBINUR, KETONESUR, PROTEINUR, UROBILINOGEN, NITRITE, LEUKOCYTESUR  Radiological Exams on Admission: MR Brain W and Wo Contrast  Result Date: 07/18/2020 CLINICAL DATA:  Headache.  Post LP headache. EXAM: MRI HEAD WITHOUT AND WITH CONTRAST TECHNIQUE: Multiplanar, multiecho pulse sequences of the brain and surrounding structures were obtained without and with intravenous contrast. CONTRAST:  10mL GADAVIST GADOBUTROL 1 MMOL/ML IV SOLN COMPARISON:  MRI October 24, 2014. FINDINGS: Brain: No acute infarction, hemorrhage, hydrocephalus, extra-axial collection or mass lesion. Mild to moderate scattered T2/FLAIR hyperintensities with the white matter, which are advanced in number for age. No abnormal enhancement. Mamillopontine distance and cerebellar tonsil position are similar to prior MRI. Vascular: Major arterial flow voids are maintained at the skull base. Skull and upper cervical spine: Normal marrow signal. Sinuses/Orbits: Mild scattered paranasal sinus mucosal thickening without air-fluid levels. Other: No mastoid effusions. IMPRESSION: 1. No evidence of acute intracranial abnormality. 2. Mild to moderate scattered T2/FLAIR hyperintensities with the white matter, which are advanced in number for age. These are nonspecific and may be secondary to chronic microvascular ischemic disease, chronic migraines, or prior demyelination or inflammation Electronically Signed   By: Feliberto HartsFrederick S Jones MD   On: 07/18/2020 17:14   MR Lumbar Spine W Wo Contrast  Result Date: 07/18/2020 CLINICAL DATA:  Low back pain, progressive neurological deficit. Infection suspected post lumbar puncture. EXAM: MRI LUMBAR SPINE WITHOUT AND WITH CONTRAST TECHNIQUE:  Multiplanar and multiecho pulse sequences of the lumbar spine were obtained without and with intravenous contrast. CONTRAST:  10mL GADAVIST GADOBUTROL 1 MMOL/ML IV SOLN COMPARISON:  None. FINDINGS: Segmentation:  Standard. Alignment:  Physiologic. Vertebrae: No fracture, evidence of discitis, or bone lesion. Congenitally small spinal canal. Conus medullaris and cauda equina: Conus extends to the L1 level. Conus and cauda equina appear normal. Paraspinal and other soft tissues: Negative. Disc levels: T12-L1: No spinal canal or neural foraminal stenosis. L1-2: No spinal canal or neural foraminal stenosis. L2-3: No spinal canal or neural foraminal stenosis. L3-4: Mild facet degenerative changes. No significant spinal canal or neural foraminal stenosis. L4-5: Mild loss of disc height, disc bulge, moderate facet degenerative changes with bilateral joint effusion and ligamentum flavum redundancy which in association with the congenitally small spinal canal results in  severe spinal canal stenosis and mild bilateral neural foraminal narrowing. Periarticular edema and contrast enhancement is seen at the bilateral facet joints. L5-S1: Mild facet degenerative changes. No spinal canal or neural foraminal stenosis. IMPRESSION: 1. No evidence of discitis/osteomyelitis or epidural abscess. 2. 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 3. Moderate facet degenerative changes at L4-5 with bilateral joint effusion and surrounding edema and contrast enhancement. Findings may be seen with degenerative facet arthritis versus septic arthritis. Electronically Signed   By: Baldemar Lenis M.D.   On: 07/18/2020 17:15    EKG: Personally reviewed.  Normal sinus rhythm without acute ischemic changes.  Assessment/Plan Principal Problem:   Post-dural puncture headache   Heith R Diaz is a 41 y.o.  male with medical history significant for obesity s/p gastric bypass, migraine headaches who is admitted with persistent severe headaches in the post LP setting.  Post dural puncture headache: Persistent severe positional headache post LP performed 3/4.  MRI L-spine without evidence of discitis/osteomyelitis or epidural abscess.  L4-5 bilateral facet joint effusion noted, doubt septic arthritis as LP was performed at level of L2-L3. -Neurology following, continue recommendations as below -Keep supine, bedrest tonight -Continue IV fluid hydration overnight -Encourage oral caffeine intake -Consideration for blood patch if no significant response to conservative therapies -Will hold anticoagulation and keep n.p.o. after midnight in case blood patch pursued  L4-L5 severe spinal canal stenosis: Seen on MRI lumbar spine.  Likely the cause of patient's chronic lower back pain and radiculopathy.  DVT prophylaxis: SCDs Code Status: Full code, confirmed with patient Family Communication: Discussed with patient, he has discussed with family Disposition Plan: From home and likely discharged home pending symptomatic improvement and further neurology recommendations Consults called: Neurology Level of care: Med-Surg Admission status:  Status is: Observation  The patient remains OBS appropriate and will d/c before 2 midnights.  Dispo: The patient is from: Home              Anticipated d/c is to: Home              Patient currently is not medically stable to d/c.   Difficult to place patient No    Darreld Mclean MD [facility]  If 7PM-7AM, please contact night-coverage [email or web address]  07/18/2020, 6:48 PM

## 2020-07-18 NOTE — ED Notes (Signed)
Per Dr. Allena Katz, pt can eat but NPO after midnight.

## 2020-07-18 NOTE — ED Notes (Signed)
Patient transported to MRI 

## 2020-07-18 NOTE — ED Provider Notes (Signed)
Received patient as a handoff at shift change from [facility], PA-C.  In short, patient is a 40 year old male with history of B12 deficiency and Roux-en-Y gastric bypass who was s/p lumbar puncture performed 07/15/2020 at [facility] to evaluate for IIH who presents the ED with complaints of migraine headaches, stiff neck, and paresthesias.  Evidently his opening pressure and LP was unremarkable. Hand-off provider spoke with Dr. Art Buff, neurology, who recommended MRI brain and lumbar spine.  Imaging was ordered and pending.  They will see patient.    Physical Exam  BP (!) 164/101   Pulse (!) 59   Temp 98.2 F (36.8 C) (Oral)   Resp 15   SpO2 100%   Physical Exam Vitals and nursing note reviewed. Exam conducted with a chaperone present.  Constitutional:      General: He is not in acute distress.    Appearance: Normal appearance. He is not toxic-appearing.  HENT:     Head: Normocephalic and atraumatic.  Eyes:     General: No scleral icterus.    Conjunctiva/sclera: Conjunctivae normal.  Cardiovascular:     Rate and Rhythm: Normal rate.  Pulmonary:     Effort: Pulmonary effort is normal. No respiratory distress.  Skin:    General: Skin is dry.  Neurological:     Mental Status: He is alert and oriented to person, place, and time.     GCS: GCS eye subscore is 4. GCS verbal subscore is 5. GCS motor subscore is 6.  Psychiatric:        Mood and Affect: Mood normal.        Behavior: Behavior normal.        Thought Content: Thought content normal.    ED Course/Procedures     Procedures Results for orders placed or performed during the hospital encounter of 07/18/20  CBC  Result Value Ref Range   WBC 6.6 4.0 - 10.5 K/uL   RBC 5.12 4.22 - 5.81 MIL/uL   Hemoglobin 12.8 (L) 13.0 - 17.0 g/dL   HCT 71.2 45.8 - 09.9 %   MCV 84.8 80.0 - 100.0 fL   MCH 25.0 (L) 26.0 - 34.0 pg   MCHC 29.5 (L) 30.0 - 36.0 g/dL   RDW 83.3 (H) 82.5 - 05.3 %   Platelets 259 150 - 400 K/uL    nRBC 0.0 0.0 - 0.2 %  Basic metabolic panel  Result Value Ref Range   Sodium 141 135 - 145 mmol/L   Potassium 3.8 3.5 - 5.1 mmol/L   Chloride 110 98 - 111 mmol/L   CO2 21 (L) 22 - 32 mmol/L   Glucose, Bld 83 70 - 99 mg/dL   BUN 13 6 - 20 mg/dL   Creatinine, Ser 9.76 0.61 - 1.24 mg/dL   Calcium 8.8 (L) 8.9 - 10.3 mg/dL   GFR, Estimated >73 >41 mL/min   Anion gap 10 5 - 15   MR Brain W and Wo Contrast  Result Date: 07/18/2020 CLINICAL DATA:  Headache.  Post LP headache. EXAM: MRI HEAD WITHOUT AND WITH CONTRAST TECHNIQUE: Multiplanar, multiecho pulse sequences of the brain and surrounding structures were obtained without and with intravenous contrast. CONTRAST:  29mL GADAVIST GADOBUTROL 1 MMOL/ML IV SOLN COMPARISON:  MRI October 24, 2014. FINDINGS: Brain: No acute infarction, hemorrhage, hydrocephalus, extra-axial collection or mass lesion. Mild to moderate scattered T2/FLAIR hyperintensities with the white matter, which are advanced in number for age. No abnormal enhancement. Mamillopontine distance and cerebellar tonsil position are  similar to prior MRI. Vascular: Major arterial flow voids are maintained at the skull base. Skull and upper cervical spine: Normal marrow signal. Sinuses/Orbits: Mild scattered paranasal sinus mucosal thickening without air-fluid levels. Other: No mastoid effusions. IMPRESSION: 1. No evidence of acute intracranial abnormality. 2. Mild to moderate scattered T2/FLAIR hyperintensities with the white matter, which are advanced in number for age. These are nonspecific and may be secondary to chronic microvascular ischemic disease, chronic migraines, or prior demyelination or inflammation Electronically Signed   By: Feliberto Harts MD   On: 07/18/2020 17:14   MR Lumbar Spine W Wo Contrast  Result Date: 07/18/2020 CLINICAL DATA:  Low back pain, progressive neurological deficit. Infection suspected post lumbar puncture. EXAM: MRI LUMBAR SPINE WITHOUT AND WITH CONTRAST  TECHNIQUE: Multiplanar and multiecho pulse sequences of the lumbar spine were obtained without and with intravenous contrast. CONTRAST:  63mL GADAVIST GADOBUTROL 1 MMOL/ML IV SOLN COMPARISON:  None. FINDINGS: Segmentation:  Standard. Alignment:  Physiologic. Vertebrae: No fracture, evidence of discitis, or bone lesion. Congenitally small spinal canal. Conus medullaris and cauda equina: Conus extends to the L1 level. Conus and cauda equina appear normal. Paraspinal and other soft tissues: Negative. Disc levels: T12-L1: No spinal canal or neural foraminal stenosis. L1-2: No spinal canal or neural foraminal stenosis. L2-3: No spinal canal or neural foraminal stenosis. L3-4: Mild facet degenerative changes. No significant spinal canal or neural foraminal stenosis. L4-5: Mild loss of disc height, disc bulge, moderate facet degenerative changes with bilateral joint effusion and ligamentum flavum redundancy which in association with the congenitally small spinal canal results in severe spinal canal stenosis and mild bilateral neural foraminal narrowing. Periarticular edema and contrast enhancement is seen at the bilateral facet joints. L5-S1: Mild facet degenerative changes. No spinal canal or neural foraminal stenosis. IMPRESSION: 1. No evidence of discitis/osteomyelitis or epidural abscess. 2. 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 3. Moderate facet degenerative changes at L4-5 with bilateral joint effusion and surrounding edema and contrast enhancement. Findings may be seen with degenerative facet arthritis versus septic arthritis. Electronically Signed   By: Baldemar Lenis M.D.   On: 07/18/2020 17:15    MDM   On my examination, patient states that he has amitriptyline and sumatriptan at home to help with his migraine headaches.  He states that it felt different however  because it was described as a "pressure" worse with standing up in sitting down.  He denies any significant pain symptoms when lying flat.  He states that he is also having low back discomfort with radicular paresthesias down his legs when standing up or ambulating.  He states that he has a history of sciatica, but this was also unusual for him.  Denied any fevers, incontinence, penile dysfunction, true numbness or weakness, or other focal deficits.   MR brain and lumbar spine are reviewed and largely unremarkable.  Patient was seen by Cindie Laroche NP and they are recommending hopitalist admission for observation and possible blood patch if unimproved with conservative therapy.  Please refer to consult note for full report.  Spoke with hospitalist Dr. Allena Katz who will see him.      Lorelee New, PA-C 07/18/20 Teola Bradley, MD 07/18/20 2227

## 2020-07-18 NOTE — Consult Note (Signed)
Neurology Consultation  Reason for Consult: Postural headache s/p lumbar puncture  Referring Physician: Dr. Audley HoseHong  CC: Headache  History is obtained from: Patient, chart review  HPI: Vincent Diaz is a 40 y.o. male with a medical history significant for obstructive sleep apnea, erectile dysfunction, vitamin B12 and vitamin D deficiency, Roux-en-Y gastric bypass in 2010, and migraine headaches who presents to the ED today for evaluation of persistent and progressive headache. He states that he usually suffers from migraine headaches but this headache has been progressive and much worse than his typical migraine. [facility] Friday, [facility]th for further evaluation of his migraine headaches and suspected idiopathic intracranial hypertension. Opening pressure was documented at 28 cmH2O. Patient endorses improvement of headache initially with CSF drainage and with worsening headache with sneezing and coughing. His current headache is described as more severe with pressure and tightness posteriorly and neck pain. He states the pain in his neck is only noticed after he has been standing or sitting up for a prolonged period and he does not have limited ROM of his neck or pain on palpation. Headache improves with supine positioning and is aggravated by standing and ambulation. He does endorse having a pre-syncopal event this morning when standing from the table leading to a fall. He also endorses painful paresthesias in his bilateral buttock that travel down each leg with increasing pain in his lower back with leg raise.   He describes his headache following the LP as intermittent. He got up on Saturday AM without the headache, but after being ambulatory at work for about 8 hours, the headache began with symptoms as described above. His headache recurred again on Sunday after being initially absent that AM. Again, being mobile/ambulatory or in a sitting position for  an extended period seems to trigger the headache. Today, the onset of his headache was sooner than on the 2 prior days, so the patient decided to be seen in the ED with the encouragement of his wife. Of note, it is non-throbbing, was 8/10 at its worst and is currently 3/10 after laying supine since about 1:30 PM today, with one brief episode up and OOB to use the bathroom. The headache is along the posterior aspect of his head bilaterally, near the vertex and also occipitally.   He also endorses a history of bilateral sciatica symptoms. He has been worked up for this, but he states that no definitive diagnosis for the underlying etiology has been found.   At baseline, [name] suffers from chronic migraine headaches, usually wakes with unilateral headache with associated phonophobia and photophobia and headache resolves within 2-7 hours. He typically retreats to a dark and quiet room to rest to help his headaches. His migraines occur 3-4 times weekly. He recently was changed from Topamax to amitriptyline at night for migraine prophylaxis at home because Topamax did not relieve his headaches. He denies nausea, vomiting, and visual changes normally with his migraines.   ROS: A 14 point ROS was performed and is negative except as noted in the HPI.   Past Medical History:  Diagnosis Date  . ED (erectile dysfunction)   . Headache   . History of chicken pox   . OSA (obstructive sleep apnea) 2012   mild, AHI 5.51 during total sleep time, 13.87 during REM sleep, rec CPAP (Dr. Mayford Knifeurner)  . Premature ejaculation    Dr. Evelene CroonWolff  . S/P gastric bypass 2010   roux en y -  400 to 200s lbs  . Vitamin B12 deficiency   . Vitamin D deficiency    Past Surgical History:  Procedure Laterality Date  . GASTRIC BYPASS  2010   Roux en Y ([place])   Family History  Problem Relation Age of Onset  . Hypertension Paternal Grandmother   . Cancer Maternal Aunt        breast  . Drug abuse Mother        h/o  . Cancer  Mother        under evaluation for pancreatic cancer  . Cancer Maternal Uncle 43       prostate (age 39-60s), some deceased  . Diabetes Neg Hx   . CAD Neg Hx   . Stroke Neg Hx     Social History:   reports that he has never smoked. He has never used smokeless tobacco. He reports current alcohol use. He reports that he does not use drugs.  Medications Current Outpatient Medications  Medication Instructions  . Cholecalciferol (VITAMIN D3) 50000 units TABS 1 tablet, Oral, Weekly  . cyanocobalamin ((VITAMIN B-12)) 1,000 mcg, Intramuscular, Every 30 days  . ferrous sulfate 325 mg, Oral, Every M-W-F  . ibuprofen (ADVIL) 200 mg, Oral, Every 6 hours PRN  . sertraline (ZOLOFT) 100 MG tablet TAKE 1 TABLET BY MOUTH EVERY DAY  . topiramate (TOPAMAX) 50 mg, Oral, Daily  . Vitamin D 2,000 Units, Oral, Daily  amitriptyline 25 mg tablet; increase as tolerated up to three tablets at bedtime. Sumatriptan 100 mg tablet take one tablet by mouth q3h as needed for migraine, maximum 200 mg/day.  (No longer taking Topamax at home)  Exam: Current vital signs: BP (!) 164/101   Pulse (!) 59   Temp 98.2 F (36.8 C) (Oral)   Resp 15   SpO2 100%  Vital signs in last 24 hours: Temp:  [98.2 F (36.8 C)] 98.2 F (36.8 C) (03/07 1246) Pulse Rate:  [59-88] 59 (03/07 1530) Resp:  [15-19] 15 (03/07 1530) BP: (141-164)/(96-104) 164/101 (03/07 1530) SpO2:  [100 %] 100 % (03/07 1530)  GENERAL: Awake, alert, sitting up in bed watching television, no acute distress Head: - Normocephalic and atraumatic EENT: Normal conjunctiva, no OP obstruction LUNGS - Normal respiratory effort, non-labored breathing CV - regular rate on cardiac monitor ABDOMEN - Soft, nontender Ext: warm, well perfused  NEURO:  Mental Status: alert and oriented to person, place, time, month, year, and situation. He is able to provide a clear and coherent history of present illness. Speech is clear without aphasia or dysarthria. No neglect  present.  Cranial Nerves:  II: PERRL 3 mm/brisk. Visual fields full.  III, IV, VI: EOMI. Lid elevation symmetric and full.  V: Sensation is intact to light touch and symmetrical to face.   VII: Face is symmetric resting and smiling. Able to puff cheeks and raise eyebrows.  VIII: Hearing intact to voice IX, X: Palate elevation is symmetric. Phonation normal.  XI: Normal sternocleidomastoid and trapezius muscle strength XII: Tongue protrudes midline without fasciculations.   Motor: 5/5 strength in bilateral upper extremities without pronator drift. Bilateral lower extremities with 4/5 strength with pain limited examination. Pain in lower back and buttock with leg rise.  Tone is normal. Bulk is normal.  Sensation- Intact to light touch bilaterally in all four extremities. Extinction intact. 2 point discrimination. Coordination: FTN intact bilaterally. No pronator drift. DTRs: 1+ patellae, 2+ and symmetric biceps  Gait- deferred  Labs I have reviewed labs in epic and the  results pertinent to this consultation are: CBC    Component Value Date/Time   WBC 6.6 07/18/2020 1313   RBC 5.12 07/18/2020 1313   HGB 12.8 (L) 07/18/2020 1313   HGB 12.6 (L) 12/26/2016 1718   HCT 43.4 07/18/2020 1313   HCT 38.9 12/26/2016 1718   PLT 259 07/18/2020 1313   PLT 287 12/26/2016 1718   MCV 84.8 07/18/2020 1313   MCV 82 12/26/2016 1718   MCH 25.0 (L) 07/18/2020 1313   MCHC 29.5 (L) 07/18/2020 1313   RDW 15.7 (H) 07/18/2020 1313   RDW 15.1 12/26/2016 1718   LYMPHSABS 2.0 12/26/2016 1718   MONOABS 1.0 04/21/2013 0425   EOSABS 0.1 12/26/2016 1718   BASOSABS 0.0 12/26/2016 1718   CMP     Component Value Date/Time   NA 141 07/18/2020 1313   NA 143 12/26/2016 1718   K 3.8 07/18/2020 1313   CL 110 07/18/2020 1313   CO2 21 (L) 07/18/2020 1313   GLUCOSE 83 07/18/2020 1313   BUN 13 07/18/2020 1313   BUN 19 12/26/2016 1718   CREATININE 1.13 07/18/2020 1313   CALCIUM 8.8 (L) 07/18/2020 1313   PROT  6.4 02/16/2016 1558   ALBUMIN 4.1 02/16/2016 1558   AST 20 02/16/2016 1558   ALT 14 02/16/2016 1558   ALKPHOS 79 02/16/2016 1558   BILITOT 0.3 02/16/2016 1558   GFRNONAA >60 07/18/2020 1313   GFRAA 102 12/26/2016 1718   CSF studies obtained from chart review: CSF Cell Count With Differential (07/15/2020 9:15 AM EST) CSF Cell Count With Differential (07/15/2020 9:15 AM EST)  Component Value Ref Range Performed At Pathologist Signature  Color, CSF Colorless Colorless [place] [facility]   Appearance, CSF Clear Clear [place] [facility]   WBC, CSF 3 0 - 10 /MM3 [place] [facility]   RBC, CSF 0 0 /MM3 [place] [facility]   Container, CSF Tube 3  [place] [facility]    CSF Cell Count With Differential (07/15/2020 9:15 AM EST)  Specimen  Cerebrospinal Fluid - Cerebrospinal fluid   Protein, CSF (07/15/2020 9:15 AM EST) Protein, CSF (07/15/2020 9:15 AM EST)  Component Value Ref Range Performed At Pathologist Signature  Protein, CSF 38 15 - 45 MG/DL [place] [facility]    Glucose, CSF (07/15/2020 9:15 AM EST) Glucose, CSF (07/15/2020 9:15 AM EST)  Component Value Ref Range Performed At Pathologist Signature  Glucose, CSF 50 40 - 70 MG/DL [place] [facility]    Imaging I have reviewed the images obtained: CT head 07/15/20 prior to lumbar puncture (per Care Everywhere chart review):  "No acute intracranial abnormality. Small amount of nonspecific frothy secretions within the left sphenoid sinus, which can be seen with acute sinusitis. "  MRI brain with and without contrast: IMPRESSION: 1. No evidence of acute intracranial abnormality. 2. Mild to moderate scattered T2/FLAIR hyperintensities with the white matter, which are advanced in number for age. These are nonspecific and may be secondary to chronic microvascular ischemic disease, chronic migraines, or prior  demyelination or inflammation  MRI lumbar spine with and without contrast: IMPRESSION: 1. No evidence of discitis/osteomyelitis or epidural abscess. 2. 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 3. Moderate facet degenerative changes at L4-5 with bilateral joint effusion and surrounding edema and contrast enhancement. Findings may  be seen with degenerative facet arthritis versus septic arthritis.  Assessment: 40 year old male with history as above presents to the ED today for evaluation of progressively worsening headache symptoms since lumbar puncture on Friday 3/4. Headache is more severe than usual migraine headache. It is improved by lying supine and exacerbated by sitting and standing.  - Headache following lumbar puncture with increasing sensation of tightness and pressure posteriorly with exacerbation in the sitting and standing positions and improvement when supine. 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 Likely post-LP headache (see above).  - MRI with moderate facet degenerative changes at L4-5 with bilateral joint effusion and surrounding edema. Not at the level of LP insertion (L2-L3) low suspicion for septic arthritis at this time.  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 This may provide an explanation for his history of bilateral sciatica.  .   Recommendations:  Post-LP headache: - Supine/bedrest for 1-2 days with IVF 1L every 8 hours - Liberal PO caffeine intake - Must remain supine for the entire period. No upright bathroom breaks. Will need to use  bedpan/urinal.  - Consider blood patch with anesthesia if there is progressive/worsening symptoms after IVF and caffeine conservative therapies. The patient states that he will decide in the AM whether to continue bedrest in the hospital for a full 24 or 48 hour period. If staying for 24 hours, then this time period will end at 1:30 PM on Tuesday. If he stays for 48 hours, would have him stand and walk with PT on Wednesday after 1:30 PM and assess for possible recurrence of his headache.  - Daily CBC, monitor fever curve - Neurology will continue to follow  History of bilateral sciatica: - Outpatient Neurosurgery consult for opinion on benefits/risks of possible surgery given the L-spine MRI findings of severe spinal canal stenosis at L4-L5 documented in the Assessment above.     Pt seen by NP/Neuro and later by MD. Note/plan to be edited by MD as needed.  Lanae Boast, AGAC-NP [facility] Pager: [phone]  I have seen and examined the patient. I have reviewed the assessment and recommendations with amendations made. 40 year old male with presenting symptoms and history that are most consistent with post-LP headache. Recommendations as above.  Electronically signed: Dr. Caryl Pina

## 2020-07-18 NOTE — ED Triage Notes (Signed)
Pt had spinal tap done on Friday at [facility] to help dx why he's having migraines. Saturday developed some tingling in his legs, which has continued/worsened throughout the weekend until today. Today went to the dentist and has since noon developed stiffening in his neck and worse pain to puncture site.

## 2020-07-19 DIAGNOSIS — R519 Headache, unspecified: Secondary | ICD-10-CM | POA: Diagnosis not present

## 2020-07-19 LAB — CBC
HCT: 36.1 % — ABNORMAL LOW (ref 39.0–52.0)
Hemoglobin: 11.4 g/dL — ABNORMAL LOW (ref 13.0–17.0)
MCH: 25.1 pg — ABNORMAL LOW (ref 26.0–34.0)
MCHC: 31.6 g/dL (ref 30.0–36.0)
MCV: 79.5 fL — ABNORMAL LOW (ref 80.0–100.0)
Platelets: 253 10*3/uL (ref 150–400)
RBC: 4.54 MIL/uL (ref 4.22–5.81)
RDW: 15.8 % — ABNORMAL HIGH (ref 11.5–15.5)
WBC: 6.1 10*3/uL (ref 4.0–10.5)
nRBC: 0 % (ref 0.0–0.2)

## 2020-07-19 LAB — BASIC METABOLIC PANEL
Anion gap: 8 (ref 5–15)
BUN: 12 mg/dL (ref 6–20)
CO2: 20 mmol/L — ABNORMAL LOW (ref 22–32)
Calcium: 8.3 mg/dL — ABNORMAL LOW (ref 8.9–10.3)
Chloride: 110 mmol/L (ref 98–111)
Creatinine, Ser: 0.98 mg/dL (ref 0.61–1.24)
GFR, Estimated: >60 mL/min (ref >60)
Glucose, Bld: 89 mg/dL (ref 70–99)
Potassium: 4 mmol/L (ref 3.5–5.1)
Sodium: 138 mmol/L (ref 135–145)

## 2020-07-19 LAB — HIV ANTIBODY (ROUTINE TESTING W REFLEX): HIV Screen 4th Generation wRfx: NONREACTIVE

## 2020-07-19 LAB — SARS CORONAVIRUS 2 (TAT 6-24 HRS): SARS Coronavirus 2: NEGATIVE

## 2020-07-19 NOTE — Progress Notes (Addendum)
Neurology Progress Note Subjective: No acute overnight events.  Discussed plan of care with patient. He is willing to stay inpatient for further monitoring for 48 hours provided diet order and alter strict bedrest order to bedrest with bathroom privileges.  48 hour observation preferred for evaluation of possible blood patch for headache management.  States that with supine positioning, IV fluids, and liberal PO caffeine intake, his headache today is gone with 0/10 pain compared to yesterday. He states he did ambulate to restroom twice overnight without complications.  Exam: Vitals:   07/19/20 0358 07/19/20 0720  BP: (!) 151/98 (!) 148/99  Pulse: 64 (!) 54  Resp: 19 18  Temp: 98.2 F (36.8 C) 98 F (36.7 C)  SpO2: 98% 96%   Gen: Laying in bed watching television, in no acute distress Resp: non-labored breathing, no respiratory distress Abd: soft, non-distended, non-tender  NEURO:  Mental Status: alert and oriented to person, place, time, month, year, and situation. He is able to provide a clear and coherent history of present illness. Speech is clear without aphasia or dysarthria. No neglect present.  Cranial Nerves:  II: PERRL 3 mm/brisk. Visual fields full.  III, IV, VI: EOMI. Lid elevation symmetric and full.  V: Sensation is intact to light touch and symmetrical to face.   VII: Face is symmetric resting and smiling. Able to puff cheeks and raise eyebrows.  VIII: Hearing intact to voice IX, X: Palate elevation is symmetric. Phonation normal.  XI: Normal sternocleidomastoid and trapezius muscle strength XII: Tongue protrudes midline without fasciculations.   Motor: 5/5 strength in bilateral upper extremities without pronator drift. Bilateral lower extremities with 5/5 strength, improved today without complaints of further leg/buttock/back pain.  Tone is normal. Bulk is normal.  Sensation- Intact to light touch bilaterally in all four extremities. Extinction intact. 2 point  discrimination. Coordination: FTN intact bilaterally. No pronator drift. Gait- deferred  Pertinent Labs: CBC    Component Value Date/Time   WBC 6.1 07/19/2020 0340   RBC 4.54 07/19/2020 0340   HGB 11.4 (L) 07/19/2020 0340   HGB 12.6 (L) 12/26/2016 1718   HCT 36.1 (L) 07/19/2020 0340   HCT 38.9 12/26/2016 1718   PLT 253 07/19/2020 0340   PLT 287 12/26/2016 1718   MCV 79.5 (L) 07/19/2020 0340   MCV 82 12/26/2016 1718   MCH 25.1 (L) 07/19/2020 0340   MCHC 31.6 07/19/2020 0340   RDW 15.8 (H) 07/19/2020 0340   RDW 15.1 12/26/2016 1718   LYMPHSABS 2.0 12/26/2016 1718   MONOABS 1.0 04/21/2013 0425   EOSABS 0.1 12/26/2016 1718   BASOSABS 0.0 12/26/2016 1718   CMP     Component Value Date/Time   NA 138 07/19/2020 0340   NA 143 12/26/2016 1718   K 4.0 07/19/2020 0340   CL 110 07/19/2020 0340   CO2 20 (L) 07/19/2020 0340   GLUCOSE 89 07/19/2020 0340   BUN 12 07/19/2020 0340   BUN 19 12/26/2016 1718   CREATININE 0.98 07/19/2020 0340   CALCIUM 8.3 (L) 07/19/2020 0340   PROT 6.4 02/16/2016 1558   ALBUMIN 4.1 02/16/2016 1558   AST 20 02/16/2016 1558   ALT 14 02/16/2016 1558   ALKPHOS 79 02/16/2016 1558   BILITOT 0.3 02/16/2016 1558   GFRNONAA >60 07/19/2020 0340   GFRAA 102 12/26/2016 1718   Imaging I have reviewed the images obtained: CT head 07/15/20 prior to lumbar puncture (per Care Everywhere chart review):  "No acute intracranial abnormality. Small amount of nonspecific frothy secretions  within the left sphenoid sinus, which can be seen with acute sinusitis."  MRI brain with and without contrast: IMPRESSION: 1. No evidence of acute intracranial abnormality. 2. Mild to moderate scattered T2/FLAIR hyperintensities with the white matter, which are advanced in number for age. These are nonspecific and may be secondary to chronic microvascular ischemic disease, chronic migraines, or prior demyelination or inflammation  MRI lumbar spine with and without  contrast: IMPRESSION: 1. No evidence of discitis/osteomyelitis or epidural abscess. 2. 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 3. Moderate facet degenerative changes at L4-5 with bilateral joint effusion and surrounding edema and contrast enhancement. Findings may be seen with degenerative facet arthritis versus septic Arthritis.  Assessment: 40 year old male with history as above presents to the ED today for evaluation of progressively worsening headache symptoms since lumbar puncture on Friday 3/4. Headache is more severe than usual migraine headache. It is improved by lying supine and exacerbated by sitting and standing.  - Headache following lumbar puncture with increasing sensation of tightness and pressure posteriorly with exacerbation in the sitting and standing positions and improvement when supine. 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 Likely post-LP headache (see above).  - MRI with moderate facet degenerative changes at L4-5 with bilateral joint effusion and surrounding edema. Not at the level of LP insertion (L2-L3) low suspicion for septic arthritis at this time.  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 This may provide an explanation for his history of bilateral sciatica.   Recommendations: Post-LP headache: - Supine/bedrest for 1-2 days with IVF 1L every 8 hours - Liberal PO caffeine intake - Preferred to remain supine for the entire period with no upright bathroom breaks. Preferred to use bedpan/urinal. Patient agreeable for 48 hour observation  under the condition that he is able to eat and have upright bathroom privileges. Understand that if he were to not stay, he would be upright at home. NP discussed importance of positioning in prevention of headache, patient verbalized understanding. - Consider blood patch with anesthesia if there is progressive/worsening symptoms after IVF and caffeine conservative therapies. The patient states that he is agreeable to continue bedrest in the hospital for a full 48 hour period. The full 48 hours will end at 1:30 PM 3/9: plan to have him stand and walk with PT on Wednesday after 1:30 PM and assess for possible recurrence of his headache.  - Daily CBC, monitor fever curve - Neurology will continue to follow  History of bilateral sciatica: - Outpatient Neurosurgery consult for opinion on benefits/risks of possible surgery given the L-spine MRI findings of severe spinal canal stenosis at L4-L5 documented in the Assessment above.    Lanae Boast, AGACNP-BC [facility] [phone]   Plan discussed with NP, not seen by myself. Billing per NP.  Brooke Dare MD-PhD [facility] [phone]

## 2020-07-19 NOTE — TOC Initial Note (Signed)
Transition of Care [facility]) - Initial/Assessment Note    Patient Details  Name: Vincent Diaz MRN: [id number] Date of Birth: 20-Aug-1980  Transition of Care [facility]) CM/SW Contact:    Kermit Balo, RN Phone Number: 07/19/2020, 11:08 AM  Clinical Narrative:                 Patient is from home with spouse. He states he will have needed supervision at home and no issues with transportation or home meds.  PCP: Dr Earl Gala at [facility]  [facility] following for d/c needs.   Expected Discharge Plan: Home/Self Care Barriers to Discharge: Continued Medical Work up   Patient Goals and CMS Choice        Expected Discharge Plan and Services Expected Discharge Plan: Home/Self Care       Living arrangements for the past 2 months: Single Family Home                                      Prior Living Arrangements/Services Living arrangements for the past 2 months: Single Family Home Lives with:: Spouse Patient language and need for interpreter reviewed:: Yes Do you feel safe going back to the place where you live?: Yes      Need for Family Participation in Patient Care: Yes (Comment) Care giver support system in place?: Yes (comment)   Criminal Activity/Legal Involvement Pertinent to Current Situation/Hospitalization: No - Comment as needed  Activities of Daily Living      Permission Sought/Granted                  Emotional Assessment Appearance:: Appears stated age Attitude/Demeanor/Rapport: Engaged Affect (typically observed): Accepting Orientation: : Oriented to Self,Oriented to Place,Oriented to  Time,Oriented to Situation   Psych Involvement: No (comment)  Admission diagnosis:  Post-dural puncture headache [G97.1] Postoperative surgical complication involving nervous system associated with nervous system procedure, unspecified complication [G97.82] Patient Active Problem List   Diagnosis Date Noted  . 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 Family history of  prostate cancer 05/25/2016  . Nutritional anemia 02/25/2016  . OSA (obstructive sleep apnea)   . Healthcare maintenance 04/27/2013  . ED (erectile dysfunction)   . Vitamin D deficiency   . Vitamin B12 deficiency   . Premature ejaculation   . Migraine 01/26/2013  . Severe obesity (BMI >= 40) (HCC) 01/26/2013  . S/P gastric bypass 01/26/2013   PCP:  Patient, No Pcp Per Pharmacy:   [id number] - [place], [place] - [address] AT [facility] [address] [place] [address] Phone: [phone] Fax: [phone]  [id number] - [address] [place] - [address] [place] [address] Phone: [phone] Fax: [phone]  [facility] [place], [place] - [address] [place], [address] [place], [address] [place] [address] Phone: [phone] Fax: [phone]  [facility] - [place], [place] - [facility] [place] [address] Phone: [phone] Fax: [phone]     Social Determinants of Health (SDOH) Interventions    Readmission Risk Interventions No flowsheet data found.

## 2020-07-19 NOTE — Progress Notes (Signed)
PROGRESS NOTE  Vincent Diaz ELF:[id number] DOB: 11/26/1980 DOA: 07/18/2020 PCP: Patient, No Pcp Per   LOS: 0 days   Brief Narrative / Interim history: 40 year old male with obesity status post gastric bypass, migraine headaches who came to the hospital with severe headache after an LP. He underwent LP on 07/15/2020 for evaluation of idiopathic intracranial hypertension.  LP performed at L2-L3 level with opening pressure documented as 28 cm H2O and closing pressure 18 cm H2O.  Approximately 26 cc clear, colorless fluid was obtained showing CSF protein 38 and glucose 50, 3 WBC, and 0 RBC.  Neurology consulted, MRI of the brain was negative, recommended admission for fluids, keep supine for 48 hours and consideration for blood patch if conservative management does not help  Subjective / 24h Interval events: No headache this morning, feels good.  In bed.  Assessment & Plan: Principal Problem Post LP headache-continue bed rest as per neurology for 48 hours until tomorrow.  Okay with bathroom privileges per neurology.  Continue IV fluids, encourage oral caffeine. If headache recurs will need a blood patch  Active Problems L4-5 severe spinal canal stenosis-cause for his chronic back pain and radiculopathy, would probably benefit from neurosurgery evaluation as an outpatient.  Obesity-status post gastric bypass, will benefit from additional weight loss moving forward  Scheduled Meds: Continuous Infusions: . sodium chloride 125 mL/hr at 07/19/20 0350   PRN Meds:.acetaminophen **OR** acetaminophen, ondansetron **OR** ondansetron (ZOFRAN) IV  Diet Orders (From admission, onward)    Start     Ordered   07/19/20 0836  Diet regular Room service appropriate? Yes; Fluid consistency: Thin  Diet effective now       Question Answer Comment  Room service appropriate? Yes   Fluid consistency: Thin      07/19/20 0835          DVT prophylaxis: SCDs Start: 07/18/20 1908     Code Status: Full  Code  Family Communication: No family at bedside  Status is: Observation  The patient will require care spanning > 2 midnights and should be moved to inpatient because: Inpatient level of care appropriate due to severity of illness  Dispo: The patient is from: Home              Anticipated d/c is to: Home              Patient currently is not medically stable to d/c.   Difficult to place patient No   Level of care: Med-Surg  Consultants:  Neurology   Procedures:  none  Microbiology  none  Antimicrobials: none    Objective: Vitals:   07/18/20 2108 07/18/20 2355 07/19/20 0358 07/19/20 0720  BP: (!) 167/92 (!) 149/94 (!) 151/98 (!) 148/99  Pulse: 80 69 64 (!) 54  Resp: 20 19 19 18   Temp: 97.9 F (36.6 C) 98.2 F (36.8 C) 98.2 F (36.8 C) 98 F (36.7 C)  TempSrc: Oral Oral Oral Oral  SpO2: 100% 99% 98% 96%    Intake/Output Summary (Last 24 hours) at 07/19/2020 1120 Last data filed at 07/19/2020 0350 Gross per 24 hour  Intake 1909.82 ml  Output 500 ml  Net 1409.82 ml   There were no vitals filed for this visit.  Examination:  Constitutional: NAD Eyes: no scleral icterus ENMT: Mucous membranes are moist.  Neck: normal, supple Respiratory: clear to auscultation bilaterally, no wheezing, no crackles.  Cardiovascular: Regular rate and rhythm, no murmurs / rubs / gallops. No LE edema. Good peripheral pulses Abdomen:  non distended, no tenderness. Bowel sounds positive.  Musculoskeletal: no clubbing / cyanosis.  Skin: no rashes Neurologic: CN 2-12 grossly intact. Strength 5/5 in all 4.  Psychiatric: Normal judgment and insight. Alert and oriented x 3. Normal mood.    Data Reviewed: I have independently reviewed following labs and imaging studies   CBC: Recent Labs  Lab 07/18/20 1313 07/19/20 0340  WBC 6.6 6.1  HGB 12.8* 11.4*  HCT 43.4 36.1*  MCV 84.8 79.5*  PLT 259 253   Basic Metabolic Panel: Recent Labs  Lab 07/18/20 1313 07/19/20 0340  NA 141  138  K 3.8 4.0  CL 110 110  CO2 21* 20*  GLUCOSE 83 89  BUN 13 12  CREATININE 1.13 0.98  CALCIUM 8.8* 8.3*   Liver Function Tests: No results for input(s): AST, ALT, ALKPHOS, BILITOT, PROT, ALBUMIN in the last 168 hours. Coagulation Profile: No results for input(s): INR, PROTIME in the last 168 hours. HbA1C: No results for input(s): HGBA1C in the last 72 hours. CBG: No results for input(s): GLUCAP in the last 168 hours.  Recent Results (from the past 240 hour(s))  SARS CORONAVIRUS 2 (TAT 6-24 HRS) Nasopharyngeal Nasopharyngeal Swab     Status: None   Collection Time: 07/18/20  7:02 PM   Specimen: Nasopharyngeal Swab  Result Value Ref Range Status   SARS Coronavirus 2 NEGATIVE NEGATIVE Final    Comment: (NOTE) SARS-CoV-2 target nucleic acids are NOT DETECTED.  The SARS-CoV-2 RNA is generally detectable in upper and lower respiratory specimens during the acute phase of infection. Negative results do not preclude SARS-CoV-2 infection, do not rule out co-infections with other pathogens, and should not be used as the sole basis for treatment or other patient management decisions. Negative results must be combined with clinical observations, patient history, and epidemiological information. The expected result is Negative.  Fact Sheet for Patients: HairSlick.no  Fact Sheet for Healthcare Providers: quierodirigir.com  This test is not yet approved or cleared by the [place] FDA and  has been authorized for detection and/or diagnosis of SARS-CoV-2 by FDA under an Emergency Use Authorization (EUA). This EUA will remain  in effect (meaning this test can be used) for the duration of the COVID-19 declaration under Se ction 564(b)(1) of the Act, 21 U.S.C. section 360bbb-3(b)(1), unless the authorization is terminated or revoked sooner.  Performed at [facility], [address] [place], [place] [address]       Radiology Studies: MR Brain W and Wo Contrast  Result Date: 07/18/2020 CLINICAL DATA:  Headache.  Post LP headache. EXAM: MRI HEAD WITHOUT AND WITH CONTRAST TECHNIQUE: Multiplanar, multiecho pulse sequences of the brain and surrounding structures were obtained without and with intravenous contrast. CONTRAST:  63mL GADAVIST GADOBUTROL 1 MMOL/ML IV SOLN COMPARISON:  MRI October 24, 2014. FINDINGS: Brain: No acute infarction, hemorrhage, hydrocephalus, extra-axial collection or mass lesion. Mild to moderate scattered T2/FLAIR hyperintensities with the white matter, which are advanced in number for age. No abnormal enhancement. Mamillopontine distance and cerebellar tonsil position are similar to prior MRI. Vascular: Major arterial flow voids are maintained at the skull base. Skull and upper cervical spine: Normal marrow signal. Sinuses/Orbits: Mild scattered paranasal sinus mucosal thickening without air-fluid levels. Other: No mastoid effusions. IMPRESSION: 1. No evidence of acute intracranial abnormality. 2. Mild to moderate scattered T2/FLAIR hyperintensities with the white matter, which are advanced in number for age. These are nonspecific and may be secondary to chronic microvascular ischemic disease, chronic migraines, or prior  demyelination or inflammation Electronically Signed   By: Feliberto Harts MD   On: 07/18/2020 17:14   MR Lumbar Spine W Wo Contrast  Result Date: 07/18/2020 CLINICAL DATA:  Low back pain, progressive neurological deficit. Infection suspected post lumbar puncture. EXAM: MRI LUMBAR SPINE WITHOUT AND WITH CONTRAST TECHNIQUE: Multiplanar and multiecho pulse sequences of the lumbar spine were obtained without and with intravenous contrast. CONTRAST:  62mL GADAVIST GADOBUTROL 1 MMOL/ML IV SOLN COMPARISON:  None. FINDINGS: Segmentation:  Standard. Alignment:  Physiologic. Vertebrae: No fracture, evidence of discitis, or bone lesion. Congenitally small spinal canal. Conus medullaris and  cauda equina: Conus extends to the L1 level. Conus and cauda equina appear normal. Paraspinal and other soft tissues: Negative. Disc levels: T12-L1: No spinal canal or neural foraminal stenosis. L1-2: No spinal canal or neural foraminal stenosis. L2-3: No spinal canal or neural foraminal stenosis. L3-4: Mild facet degenerative changes. No significant spinal canal or neural foraminal stenosis. L4-5: Mild loss of disc height, disc bulge, moderate facet degenerative changes with bilateral joint effusion and ligamentum flavum redundancy which in association with the congenitally small spinal canal results in severe spinal canal stenosis and mild bilateral neural foraminal narrowing. Periarticular edema and contrast enhancement is seen at the bilateral facet joints. L5-S1: Mild facet degenerative changes. No spinal canal or neural foraminal stenosis. IMPRESSION: 1. No evidence of discitis/osteomyelitis or epidural abscess. 2. 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 3. Moderate facet degenerative changes at L4-5 with bilateral joint effusion and surrounding edema and contrast enhancement. Findings may be seen with degenerative facet arthritis versus septic arthritis. Electronically Signed   By: Baldemar Lenis M.D.   On: 07/18/2020 17:15     Pamella Pert, MD, PhD [facility]  Between 7 am - 7 pm I am available, please contact me via Amion or Securechat  Between 7 pm - 7 am I am not available, please contact night coverage MD/APP via Amion

## 2020-07-19 NOTE — Plan of Care (Signed)

## 2020-07-20 DIAGNOSIS — Z8249 Family history of ischemic heart disease and other diseases of the circulatory system: Secondary | ICD-10-CM | POA: Diagnosis not present

## 2020-07-20 DIAGNOSIS — Z813 Family history of other psychoactive substance abuse and dependence: Secondary | ICD-10-CM | POA: Diagnosis not present

## 2020-07-20 DIAGNOSIS — M2548 Effusion, other site: Secondary | ICD-10-CM | POA: Diagnosis present

## 2020-07-20 DIAGNOSIS — G43909 Migraine, unspecified, not intractable, without status migrainosus: Secondary | ICD-10-CM | POA: Diagnosis present

## 2020-07-20 DIAGNOSIS — M5432 Sciatica, left side: Secondary | ICD-10-CM | POA: Diagnosis present

## 2020-07-20 DIAGNOSIS — Z8042 Family history of malignant neoplasm of prostate: Secondary | ICD-10-CM | POA: Diagnosis not present

## 2020-07-20 DIAGNOSIS — E559 Vitamin D deficiency, unspecified: Secondary | ICD-10-CM | POA: Diagnosis present

## 2020-07-20 DIAGNOSIS — E669 Obesity, unspecified: Secondary | ICD-10-CM | POA: Diagnosis present

## 2020-07-20 DIAGNOSIS — Z6841 Body Mass Index (BMI) 40.0 and over, adult: Secondary | ICD-10-CM | POA: Diagnosis not present

## 2020-07-20 DIAGNOSIS — G971 Other reaction to spinal and lumbar puncture: Secondary | ICD-10-CM | POA: Diagnosis present

## 2020-07-20 DIAGNOSIS — R519 Headache, unspecified: Secondary | ICD-10-CM | POA: Diagnosis present

## 2020-07-20 DIAGNOSIS — Z20822 Contact with and (suspected) exposure to covid-19: Secondary | ICD-10-CM | POA: Diagnosis present

## 2020-07-20 DIAGNOSIS — Y844 Aspiration of fluid as the cause of abnormal reaction of the patient, or of later complication, without mention of misadventure at the time of the procedure: Secondary | ICD-10-CM | POA: Diagnosis present

## 2020-07-20 DIAGNOSIS — N529 Male erectile dysfunction, unspecified: Secondary | ICD-10-CM | POA: Diagnosis present

## 2020-07-20 DIAGNOSIS — Z800 Family history of malignant neoplasm of digestive organs: Secondary | ICD-10-CM | POA: Diagnosis not present

## 2020-07-20 DIAGNOSIS — Z803 Family history of malignant neoplasm of breast: Secondary | ICD-10-CM | POA: Diagnosis not present

## 2020-07-20 DIAGNOSIS — R55 Syncope and collapse: Secondary | ICD-10-CM | POA: Diagnosis present

## 2020-07-20 DIAGNOSIS — Z9884 Bariatric surgery status: Secondary | ICD-10-CM | POA: Diagnosis not present

## 2020-07-20 DIAGNOSIS — Z79899 Other long term (current) drug therapy: Secondary | ICD-10-CM | POA: Diagnosis not present

## 2020-07-20 DIAGNOSIS — M5431 Sciatica, right side: Secondary | ICD-10-CM | POA: Diagnosis present

## 2020-07-20 DIAGNOSIS — G932 Benign intracranial hypertension: Secondary | ICD-10-CM | POA: Diagnosis present

## 2020-07-20 DIAGNOSIS — M5410 Radiculopathy, site unspecified: Secondary | ICD-10-CM | POA: Diagnosis present

## 2020-07-20 DIAGNOSIS — G4733 Obstructive sleep apnea (adult) (pediatric): Secondary | ICD-10-CM | POA: Diagnosis present

## 2020-07-20 DIAGNOSIS — M48061 Spinal stenosis, lumbar region without neurogenic claudication: Secondary | ICD-10-CM | POA: Diagnosis present

## 2020-07-20 NOTE — Plan of Care (Signed)

## 2020-07-20 NOTE — Progress Notes (Deleted)
NEUROLOGY PROGRESS NOTE  Subjective: Vincent Diaz is resting in bed with his wife at bedside. He states that he is doing well and asks when he will be discharged from the hospital. He has not had a headache since his first day in the hospital which was 07/18/20. He reports that he has ambulated around the room with nursing staff and that his headache has not returned while ambulating.   Exam: Vitals:   07/20/20 0405 07/20/20 0736  BP: 138/89 (!) 141/97  Pulse: 64 (!) 52  Resp: 18 18  Temp: 98 F (36.7 C)   SpO2: 99% 100%   Neuro:  Mental Status: AAOx4, following commands ( two fingers, gives thumbs up)   Cranial Nerves: II:  Visual fields grossly normal,  III,IV, VI: ptosis not present, extra-ocular motions intact bilaterally pupils equal, round, reactive to light and accommodation V,VII: smile symmetric, facial light touch sensation normal bilaterally VIII: hearing normal bilaterally IX,X: Palate rises midline XI: bilateral shoulder shrug XII: midline tongue extension Motor: 5/5 strength throughout  Tone and bulk: Normal tone throughout; no atrophy noted Sensory: Pinprick and light touch intact throughout, bilaterally Deep Tendon Reflexes: Deferred  Cerebellar: Intact FNF  Gait: Deferred   Imaging I have reviewed the images obtained:  CT Head 07/15/20 prior to lumbar puncture (per Care Everywhere chart review):  "No acute intracranial abnormality. Small amount of nonspecific frothy secretions within the left sphenoid sinus, which can be seen with acute sinusitis."  MRIbrain with and without contrast 07/18/20:  1. No evidence of acute intracranial abnormality. 2. Mild to moderate scattered T2/FLAIR hyperintensities with the white matter, which are advanced in number for age. These are nonspecific and may be secondary to chronic microvascular ischemic disease, chronic migraines, or prior demyelination or inflammation  MRI lumbar spine with and without contrast 07/18/20:  1. No  evidence of discitis/osteomyelitis or epidural abscess. 2. 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 3. Moderate facet degenerative changes at L4-5 with bilateral joint effusion and surrounding edema and contrast enhancement. Findingsmay be seen with degenerative facet arthritis versus septic Arthritis.  Assessment/Recommendations:  Vincent Diaz is a 40 year old male who is hospitalized for evaluation and treatment of post LP headache. His headache upon admission was noted to increase in tightness and pressure posteriorly, with exacerbation in sitting and standing positions and had improvement when supine- consistent with a post dural headache. At this point in time, after 48 hours of bed rest and IVF1 L every 8 hours his headache has improved. He has not noted a headache in 2 days now. Have relayed to the primary team that given improvement he can discharge today. Patient was informed about undergoing a blood patch if his headache returns after discharge. He is amenable to this and was provided with the phone number to reach outpatient spine who can schedule him to come in for a blood patch if necessary.  In addition, MRI L Spine done this admission showed degenerative changes superimposed on a congenitally small spinal canal resulting in severe spinal canal stenosisand mild bilateral neural foraminal narrowing at L4-L5. Recommend outpatient Neurosurgery consult for opinion on benefits/risks of possible surgery given these findings.   Neurology will sign off at this time. Thank you for consulting. Please contact neurology via Amion with any questions.   Stark Jock, NP  [facility] Patient discussed with attending physician Dr. Iver Nestle  07/20/2020, 8:38 AM

## 2020-07-20 NOTE — TOC Transition Note (Signed)
Transition of Care [facility]) - CM/SW Discharge Note   Patient Details  Name: Vincent Diaz MRN: [id number] Date of Birth: [birth year]  Transition of Care [facility]) CM/SW Contact:  Kermit Balo, RN Phone Number: 07/20/2020, 3:41 PM   Clinical Narrative:    Pt discharging home with self care. Pt is able to schedule outpatient appointments.  Pt has transportation home.    Final next level of care: Home/Self Care Barriers to Discharge: No Barriers Identified   Patient Goals and CMS Choice        Discharge Placement                       Discharge Plan and Services                                     Social Determinants of Health (SDOH) Interventions     Readmission Risk Interventions No flowsheet data found.

## 2020-07-20 NOTE — Progress Notes (Signed)
NEUROLOGY PROGRESS NOTE  Subjective: Vincent Diaz is resting in bed with his wife at bedside. He states that he is doing well and asks when he will be discharged from the hospital. He has not had a headache since his first day in the hospital which was 07/18/20. He reports that he has ambulated around the room with nursing staff and that his headache has not returned while ambulating.   Exam: Vitals:   07/20/20 0405 07/20/20 0736  BP: 138/89 (!) 141/97  Pulse: 64 (!) 52  Resp: 18 18  Temp: 98 F (36.7 C)   SpO2: 99% 100%   Neuro:  Mental Status: AAOx4, following commands ( two fingers, gives thumbs up)   Cranial Nerves: II:  Visual fields grossly normal,  III,IV, VI: ptosis not present, extra-ocular motions intact bilaterally pupils equal, round, reactive to light and accommodation V,VII: smile symmetric, facial light touch sensation normal bilaterally VIII: hearing normal bilaterally IX,X: Palate rises midline XI: bilateral shoulder shrug XII: midline tongue extension Motor: 5/5 strength throughout  Tone and bulk: Normal tone throughout; no atrophy noted Sensory: Pinprick and light touch intact throughout, bilaterally Deep Tendon Reflexes: Deferred  Cerebellar: Intact FNF  Gait: Deferred   Imaging I have reviewed the images obtained:  CT Head 07/15/20 prior to lumbar puncture (per Care Everywhere chart review):  "No acute intracranial abnormality. Small amount of nonspecific frothy secretions within the left sphenoid sinus, which can be seen with acute sinusitis."  MRIbrain with and without contrast 07/18/20:  1. No evidence of acute intracranial abnormality. 2. Mild to moderate scattered T2/FLAIR hyperintensities with the white matter, which are advanced in number for age. These are nonspecific and may be secondary to chronic microvascular ischemic disease, chronic migraines, or prior demyelination or inflammation  MRI lumbar spine with and without contrast 07/18/20:  1. No  evidence of discitis/osteomyelitis or epidural abscess. 2. 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 3. Moderate facet degenerative changes at L4-5 with bilateral joint effusion and surrounding edema and contrast enhancement. Findingsmay be seen with degenerative facet arthritis versus septic Arthritis.  Assessment/Recommendations:  Vincent Diaz is a 39 year old male who is hospitalized for evaluation and treatment of post LP headache. His headache upon admission was noted to increase in tightness and pressure posteriorly, with exacerbation in sitting and standing positions and had improvement when supine- consistent with a post dural headache. At this point in time, after 48 hours of bed rest and IVF1 L every 8 hours his headache has improved. He has not noted a headache in 2 days now. Have relayed to the primary team that given improvement he can discharge today. Patient was informed about undergoing a blood patch if his headache returns after discharge. He is amenable to this and was provided with the phone number to reach outpatient spine who can schedule him to come in for a blood patch if necessary.  In addition, MRI L Spine done this admission showed degenerative changes superimposed on a congenitally small spinal canal resulting in severe spinal canal stenosisand mild bilateral neural foraminal narrowing at L4-L5. Recommend outpatient Neurosurgery consult for opinion on benefits/risks of possible surgery given these findings.   Neurology will sign off at this time. Thank you for consulting. Please contact neurology via Amion with any questions.   Vincent Badolato, NP  [facility] Patient discussed with attending physician Dr. Bhagat  07/20/2020, 8:38 AM  

## 2020-07-20 NOTE — Discharge Summary (Signed)
Physician Discharge Summary  Vincent Diaz BMW:[id number] DOB: [birth year] DOA: 07/18/2020  PCP: Patient, No Pcp Per  Admit date: 07/18/2020 Discharge date: 07/20/2020  Time spent: 40 minutes  Recommendations for Outpatient Follow-up:  1. Follow outpatient CBC/CMP 2. Follow with neurology outpatient  3. Patient instructed to call for follow up for blood patch if headache returns after discharge 4. Follow up with neurology (his primary neurologist) to follow results of LP 5. Follow with neurosurgery outpatient to follow severe spinal canal stenosis  Discharge Diagnoses:  Principal Problem:   Post-dural puncture headache Active Problems:   Headache   Discharge Condition: stable  Diet recommendation: heart healthy  There were no vitals filed for this visit.  History of present illness:  40 year old male with obesity status post gastric bypass, migraine headaches who came to the hospital with severe headache after an LP. He underwent LP on 07/15/2020 for evaluation of idiopathic intracranial hypertension. LP performed at L2-L3 level with opening pressure documentedas 28 cm H2O and closing pressure 18 cm H2O. Approximately 26 cc clear, colorless fluid was obtained showing CSF protein 38 and glucose 50, 3 WBC, and 0 RBC.  Neurology consulted, MRI of the brain was negative, recommended admission for fluids, keep supine for 48 hours and consideration for blood patch if conservative management does not help  He was admitted for post LP headache.  Symptoms were improved after bedrest.  Discharged on 3/9 with instructions to follow up for blood patch outpatient of HA returns.  Also recommended to follow up with neurosurgery for spinal stenosis.  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  Facet degenerative changes at L4-5 with bilateral joint effusion and surrounding edema and contrast enhancement not thought 2/2 infection per discussion with neurology.  Referral placed  Obesity-status post gastric bypass, will benefit from additional weight loss moving forward  Procedures:  none  Consultations:  neurology  Discharge Exam: Vitals:   07/20/20 1159 07/20/20 1511  BP: 136/84 (!) 146/97  Pulse: (!) 56 72  Resp: 18 18  Temp: 98.7 F (37.1 C) 99.1 F (37.3 C)  SpO2: 100% 100%   Feeling better, eager to discharge home  General: No acute distress. Cardiovascular: Heart sounds show a regular rate, and rhythm. Lungs: Clear to auscultation bilaterally Abdomen: Soft, nontender, nondistended  Neurological: Alert and oriented 3. Moves all extremities 4 . Cranial nerves II through XII grossly intact. Skin: Warm and dry. No rashes or lesions. Extremities: No clubbing or cyanosis. No edema.  Discharge Instructions   Discharge Instructions    Ambulatory referral to Neurosurgery   Complete by: As directed    Call MD for:  difficulty breathing, headache or visual disturbances   Complete by: As directed    Call MD for:  extreme fatigue   Complete by: As directed    Call MD for:  hives   Complete by: As directed    Call MD for:  persistant dizziness or light-headedness   Complete by: As directed    Call MD for:  persistant nausea and vomiting   Complete by: As directed    Call MD for:  redness, tenderness, or signs of infection (pain, swelling, redness, odor or green/yellow discharge around incision site)   Complete by: As directed    Call MD for:  severe uncontrolled pain   Complete by: As directed    Call MD for:  temperature >100.4   Complete by: As directed    Diet - low sodium heart healthy   Complete by: As directed    Discharge instructions   Complete by: As directed    You were seen for a post LP  headache.  You've improved after bedrest.  Neurology is recommending you get a blood patch if your headache is recurrent.  If you have a recurrent headache, call [phone] and ask to speak with Olegario Messier, the scheduler about getting in for the blood patch.   Please follow up with neurology at [facility] for your LP results and further workup and management of your headaches.  Please follow up with neurosurgery outpatient for your abnormal L spine imaging (severe spinal canal stenosis).   Return for new, recurrent, or worsening symptoms.  Please ask your PCP to request records from this hospitalization so they know what was done and what the next steps will be.   Increase activity slowly   Complete by: As directed      Allergies as of 07/20/2020   No Known Allergies     Medication List    STOP taking these medications   ferrous sulfate 325 (65 FE) MG tablet   sertraline 100 MG tablet Commonly known as: ZOLOFT   topiramate 50 MG tablet Commonly known as: TOPAMAX   Vitamin D 50 MCG (2000 UT) Caps   Vitamin D3 1.25 MG (50000 UT) Tabs     TAKE these medications   amitriptyline 25 MG tablet Commonly known as: ELAVIL Take 75 mg by mouth at bedtime.   amoxicillin 250 MG capsule Commonly known as: AMOXIL Take 250 mg by mouth 3 (three) times daily.   cyanocobalamin 1000 MCG/ML injection Commonly known as: (VITAMIN B-12) Inject 1 mL (1,000 mcg total) into the muscle every 30 (thirty) days.   SUMAtriptan 100 MG tablet Commonly known as: IMITREX Take 100 mg by mouth every 2 (two) hours as needed for migraine or headache.      No Known Allergies    The results of significant diagnostics from this hospitalization (including imaging, microbiology, ancillary and laboratory) are listed below for reference.    Significant Diagnostic Studies: MR Brain W and Wo Contrast  Result Date: 07/18/2020 CLINICAL DATA:  Headache.  Post LP headache. EXAM: MRI HEAD WITHOUT AND WITH  CONTRAST TECHNIQUE: Multiplanar, multiecho pulse sequences of the brain and surrounding structures were obtained without and with intravenous contrast. CONTRAST:  55mL GADAVIST GADOBUTROL 1 MMOL/ML IV SOLN COMPARISON:  MRI October 24, 2014. FINDINGS: Brain: No acute infarction, hemorrhage, hydrocephalus, extra-axial collection or mass lesion. Mild to moderate scattered T2/FLAIR hyperintensities with the white matter, which are advanced in number for age. No abnormal enhancement. Mamillopontine distance and cerebellar tonsil position are similar to prior MRI. Vascular: Major arterial flow voids are maintained at the skull base. Skull and upper cervical spine: Normal marrow signal. Sinuses/Orbits: Mild scattered paranasal sinus mucosal thickening without air-fluid levels. Other: No mastoid effusions. IMPRESSION: 1. No evidence of acute intracranial abnormality. 2. Mild to moderate scattered T2/FLAIR hyperintensities with the white matter, which are advanced in number for age. These are nonspecific and may be secondary to chronic microvascular ischemic disease, chronic migraines, or prior demyelination or inflammation Electronically Signed   By: Feliberto Harts MD   On: 07/18/2020 17:14   MR Lumbar Spine W Wo Contrast  Result Date: 07/18/2020 CLINICAL DATA:  Low  back pain, progressive neurological deficit. Infection suspected post lumbar puncture. EXAM: MRI LUMBAR SPINE WITHOUT AND WITH CONTRAST TECHNIQUE: Multiplanar and multiecho pulse sequences of the lumbar spine were obtained without and with intravenous contrast. CONTRAST:  63mL GADAVIST GADOBUTROL 1 MMOL/ML IV SOLN COMPARISON:  None. FINDINGS: Segmentation:  Standard. Alignment:  Physiologic. Vertebrae: No fracture, evidence of discitis, or bone lesion. Congenitally small spinal canal. Conus medullaris and cauda equina: Conus extends to the L1 level. Conus and cauda equina appear normal. Paraspinal and other soft tissues: Negative. Disc levels: T12-L1: No  spinal canal or neural foraminal stenosis. L1-2: No spinal canal or neural foraminal stenosis. L2-3: No spinal canal or neural foraminal stenosis. L3-4: Mild facet degenerative changes. No significant spinal canal or neural foraminal stenosis. L4-5: Mild loss of disc height, disc bulge, moderate facet degenerative changes with bilateral joint effusion and ligamentum flavum redundancy which in association with the congenitally small spinal canal results in severe spinal canal stenosis and mild bilateral neural foraminal narrowing. Periarticular edema and contrast enhancement is seen at the bilateral facet joints. L5-S1: Mild facet degenerative changes. No spinal canal or neural foraminal stenosis. IMPRESSION: 1. No evidence of discitis/osteomyelitis or epidural abscess. 2. 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 3. Moderate facet degenerative changes at L4-5 with bilateral joint effusion and surrounding edema and contrast enhancement. Findings may be seen with degenerative facet arthritis versus septic arthritis. Electronically Signed   By: Baldemar Lenis M.D.   On: 07/18/2020 17:15    Microbiology: Recent Results (from the past 240 hour(s))  SARS CORONAVIRUS 2 (TAT 6-24 HRS) Nasopharyngeal Nasopharyngeal Swab     Status: None   Collection Time: 07/18/20  7:02 PM   Specimen: Nasopharyngeal Swab  Result Value Ref Range Status   SARS Coronavirus 2 NEGATIVE NEGATIVE Final    Comment: (NOTE) SARS-CoV-2 target nucleic acids are NOT DETECTED.  The SARS-CoV-2 RNA is generally detectable in upper and lower respiratory specimens during the acute phase of infection. Negative results do not preclude SARS-CoV-2 infection, do not rule out co-infections with other pathogens, and should not be used as the sole basis for treatment or other patient management  decisions. Negative results must be combined with clinical observations, patient history, and epidemiological information. The expected result is Negative.  Fact Sheet for Patients: HairSlick.no  Fact Sheet for Healthcare Providers: quierodirigir.com  This test is not yet approved or cleared by the [place] FDA and  has been authorized for detection and/or diagnosis of SARS-CoV-2 by FDA under an Emergency Use Authorization (EUA). This EUA will remain  in effect (meaning this test can be used) for the duration of the COVID-19 declaration under Se ction 564(b)(1) of the Act, 21 U.S.C. section 360bbb-3(b)(1), unless the authorization is terminated or revoked sooner.  Performed at [facility], [address], [place], [place] [address]      Labs: Basic Metabolic Panel: Recent Labs  Lab 07/18/20 1313 07/19/20 0340  NA 141 138  K 3.8 4.0  CL 110 110  CO2 21* 20*  GLUCOSE 83 89  BUN 13 12  CREATININE 1.13 0.98  CALCIUM 8.8* 8.3*   Liver Function Tests: No results for input(s): AST, ALT, ALKPHOS, BILITOT, PROT, ALBUMIN in the last 168 hours. No results for input(s): LIPASE, AMYLASE in the last 168 hours. No results for input(s): AMMONIA in the last 168 hours. CBC: Recent Labs  Lab 07/18/20 1313 07/19/20 0340  WBC 6.6 6.1  HGB 12.8* 11.4*  HCT 43.4 36.1*  MCV 84.8 79.5*  PLT 259 253   Cardiac Enzymes: No results for input(s): CKTOTAL, CKMB, CKMBINDEX, TROPONINI in the last 168 hours. BNP: BNP (last 3 results) No results for input(s): BNP in the last 8760 hours.  ProBNP (last 3 results) No results for input(s): PROBNP in the last 8760 hours.  CBG: No results for input(s): GLUCAP in the last 168 hours.     Signed:  Lacretia Nicksaldwell Powell MD.  [facility] 07/20/2020, 9:02 PM

## 2020-07-20 NOTE — Progress Notes (Signed)
Discharged to home after IV access removed and discharge instructions reviewed with.  All questions answered and pt made aware that he needs to call and schedule follow up appt with Neurology and PCP to be seen within a week to 2 weeks.

## 2022-09-27 ENCOUNTER — Emergency Department (HOSPITAL_COMMUNITY)
Admission: EM | Admit: 2022-09-27 | Discharge: 2022-09-27 | Disposition: A | Discharge status: Home / Self Care | Payer: Managed Care, Other (non HMO) | Attending: Emergency Medicine | Admitting: Emergency Medicine

## 2022-09-27 ENCOUNTER — Other Ambulatory Visit: Payer: Self-pay

## 2022-09-27 ENCOUNTER — Ambulatory Visit
Admission: EM | Admit: 2022-09-27 | Discharge: 2022-09-27 | Disposition: A | Discharge status: Home / Self Care | Payer: Managed Care, Other (non HMO)

## 2022-09-27 ENCOUNTER — Encounter (HOSPITAL_COMMUNITY): Payer: Self-pay

## 2022-09-27 DIAGNOSIS — K921 Melena: Secondary | ICD-10-CM | POA: Diagnosis not present

## 2022-09-27 DIAGNOSIS — K625 Hemorrhage of anus and rectum: Secondary | ICD-10-CM | POA: Insufficient documentation

## 2022-09-27 DIAGNOSIS — Z9884 Bariatric surgery status: Secondary | ICD-10-CM | POA: Diagnosis not present

## 2022-09-27 LAB — TYPE AND SCREEN
ABO/RH(D): A POS
Antibody Screen: NEGATIVE

## 2022-09-27 LAB — COMPREHENSIVE METABOLIC PANEL
ALT: 20 U/L (ref 0–44)
AST: 24 U/L (ref 15–41)
Albumin: 3.8 g/dL (ref 3.5–5.0)
Alkaline Phosphatase: 71 U/L (ref 38–126)
Anion gap: 6 (ref 5–15)
BUN: 19 mg/dL (ref 6–20)
CO2: 22 mmol/L (ref 22–32)
Calcium: 8.3 mg/dL — ABNORMAL LOW (ref 8.9–10.3)
Chloride: 110 mmol/L (ref 98–111)
Creatinine, Ser: 1.17 mg/dL (ref 0.61–1.24)
GFR, Estimated: >60 mL/min (ref >60)
Glucose, Bld: 89 mg/dL (ref 70–99)
Potassium: 3.8 mmol/L (ref 3.5–5.1)
Sodium: 138 mmol/L (ref 135–145)
Total Bilirubin: 0.4 mg/dL (ref 0.3–1.2)
Total Protein: 6.8 g/dL (ref 6.5–8.1)

## 2022-09-27 LAB — CBC WITH DIFFERENTIAL/PLATELET
Abs Immature Granulocytes: 0.02 10*3/uL (ref 0.00–0.07)
Basophils Absolute: 0.1 10*3/uL (ref 0.0–0.1)
Basophils Relative: 1 %
Eosinophils Absolute: 0.1 10*3/uL (ref 0.0–0.5)
Eosinophils Relative: 1 %
HCT: 40.8 % (ref 39.0–52.0)
Hemoglobin: 12.8 g/dL — ABNORMAL LOW (ref 13.0–17.0)
Immature Granulocytes: 0 %
Lymphocytes Relative: 25 %
Lymphs Abs: 1.8 10*3/uL (ref 0.7–4.0)
MCH: 26.4 pg (ref 26.0–34.0)
MCHC: 31.4 g/dL (ref 30.0–36.0)
MCV: 84.3 fL (ref 80.0–100.0)
Monocytes Absolute: 0.6 10*3/uL (ref 0.1–1.0)
Monocytes Relative: 8 %
Neutro Abs: 4.7 10*3/uL (ref 1.7–7.7)
Neutrophils Relative %: 65 %
Platelets: 256 10*3/uL (ref 150–400)
RBC: 4.84 MIL/uL (ref 4.22–5.81)
RDW: 14.6 % (ref 11.5–15.5)
WBC: 7.3 10*3/uL (ref 4.0–10.5)
nRBC: 0 % (ref 0.0–0.2)

## 2022-09-27 LAB — PROTIME-INR
INR: 1.1 (ref 0.8–1.2)
Prothrombin Time: 14.5 seconds (ref 11.4–15.2)

## 2022-09-27 MED ORDER — LIDOCAINE HCL URETHRAL/MUCOSAL 2 % EX GEL
1.0000 | Freq: Once | CUTANEOUS | Status: AC
  Administered 2022-09-27: 1
  Filled 2022-09-27: qty 11

## 2022-09-27 MED ORDER — HYDROCORTISONE ACETATE 25 MG RE SUPP: 25.0000 mg | Freq: Two times a day (BID) | RECTAL | 0 refills | Status: AC

## 2022-09-27 NOTE — ED Triage Notes (Signed)
Pt presents with c/o rectal bleeding that started today. Pt went to UC and was told to come here for rule GI bleed.

## 2022-09-27 NOTE — ED Provider Triage Note (Signed)
Emergency Medicine Provider Triage Evaluation Note  Draken R [place] , a 42 y.o. male  was evaluated in triage.  Patient complaining of BRBPR.  Reports for the past 2 days he has been having bright red blood in the toilet.  Reports that it "looks like a massacre ."  No thinners, reports alcohol use but nothing heavy, no history of GI bleed and never needed a blood transfusion.  Sent from urgent care.  Review of Systems  Positive:  Negative:   Physical Exam  BP (!) 152/111 (BP Location: Left Arm)   Pulse 62   Temp 99.1 F (37.3 C) (Oral)   Resp 18   SpO2 100%  Gen:   Awake, no distress   Resp:  Normal effort  MSK:   Moves extremities without difficulty  Other:    Medical Decision Making  Medically screening exam initiated at 6:39 PM.  Appropriate orders placed.  Kyra Manges [place] was informed that the remainder of the evaluation will be completed by another provider, this initial triage assessment does not replace that evaluation, and the importance of remaining in the ED until their evaluation is complete.     Saddie Benders, [place] 09/27/22 1839

## 2022-09-27 NOTE — ED Notes (Signed)
Patient is being discharged from the Urgent Care and sent to the Emergency Department via POV . Per Wallis Bamberg PA, patient is in need of higher level of care due to bloody stools. Patient is aware and verbalizes understanding of plan of care.  Vitals:   09/27/22 1716  BP: (!) 142/89  Pulse: 68  Resp: 20  Temp: 98.7 F (37.1 C)  SpO2: 98%

## 2022-09-27 NOTE — Discharge Instructions (Addendum)
Use the suppositories twice a day.  However if the bleeding becomes heavy or you start feeling bad like feeling dizzy, short of breath, fainting or start having abdominal or rectal pain return to the emergency room.

## 2022-09-27 NOTE — Discharge Instructions (Signed)
Please send to the emergency room as I am concerned that you have a gastrointestinal bleed that requires an urgent evaluation through the emergency room.

## 2022-09-27 NOTE — ED Provider Notes (Signed)
[place] EMERGENCY DEPARTMENT AT [facility] Provider Note   CSN: [id number] Arrival date & time: 09/27/22  1805     History  Chief Complaint  Patient presents with   Rectal Bleeding    Vincent Diaz is a 42 y.o. male.  Patient is a 42 year old male with a history of OSA, prior gastric bypass 14 years ago without ever having complications who is presenting today with complaint of rectal bleeding.  He started noticing it yesterday but yesterday it was mild.  Bright red and present when he wiped.  Stool seemed formed yesterday and normal in color.  However today he had 4 bowel movements and they were all much bloodier this time.  It was always bright red.  He denies seeing any clots in the toilet it was always present on the toilet paper.  No abdominal pain, rectal pain, dizziness, shortness of breath, chest pain or fainting.  He takes no anticoagulation.  He has never had a colonoscopy in the past.  He has been eating and drinking normally.  He does report having scant bleeding in the past but never as much as he had today.  The history is provided by the patient.  Rectal Bleeding      Home Medications Prior to Admission medications   Medication Sig Start Date End Date Taking? Authorizing Provider  hydrocortisone (ANUSOL-HC) 25 MG suppository Place 1 suppository (25 mg total) rectally 2 (two) times daily. 09/27/22  Yes Cristen Bredeson, Alphonzo Lemmings, MD  amitriptyline (ELAVIL) 25 MG tablet Take 75 mg by mouth at bedtime. 06/27/20   [provider]  amoxicillin (AMOXIL) 250 MG capsule Take 250 mg by mouth 3 (three) times daily. 07/18/20   [provider]  cyanocobalamin (,VITAMIN B-12,) 1000 MCG/ML injection Inject 1 mL (1,000 mcg total) into the muscle every 30 (thirty) days. 02/25/16   [name], MD  SUMAtriptan (IMITREX) 100 MG tablet Take 100 mg by mouth every 2 (two) hours as needed for migraine or headache. 06/27/20   [provider]       Allergies    Patient has no known allergies.    Review of Systems   Review of Systems  Gastrointestinal:  Positive for hematochezia.    Physical Exam Updated Vital Signs BP 133/89   Pulse (!) 57   Temp 99.1 F (37.3 C) (Oral)   Resp 15   Ht 5\' 10"  (1.778 m)   Wt 136.1 kg   SpO2 100%   BMI 43.05 kg/m  Physical Exam Vitals and nursing note reviewed.  Constitutional:      General: He is not in acute distress.    Appearance: He is well-developed.  HENT:     Head: Normocephalic and atraumatic.  Eyes:     Conjunctiva/sclera: Conjunctivae normal.     Pupils: Pupils are equal, round, and reactive to light.  Cardiovascular:     Rate and Rhythm: Normal rate and regular rhythm.     Heart sounds: No murmur heard. Pulmonary:     Effort: Pulmonary effort is normal. No respiratory distress.     Breath sounds: Normal breath sounds. No wheezing or rales.  Abdominal:     General: There is no distension.     Palpations: Abdomen is soft.     Tenderness: There is no abdominal tenderness. There is no guarding or rebound.  Genitourinary:    Comments: No hemorroids noted externally.  With anoscope evaluation internal hemorrhoid seen but only dried blood in the rectum.  No  acitve bleeding. Musculoskeletal:        General: No tenderness. Normal range of motion.     Cervical back: Normal range of motion and neck supple.  Skin:    General: Skin is warm and dry.     Findings: No erythema or rash.  Neurological:     Mental Status: He is alert and oriented to person, place, and time.  Psychiatric:        Behavior: Behavior normal.     ED Results / Procedures / Treatments   Labs (all labs ordered are listed, but only abnormal results are displayed) Labs Reviewed  COMPREHENSIVE METABOLIC PANEL - Abnormal; Notable for the following components:      Result Value   Calcium 8.3 (*)    All other components within normal limits  CBC WITH DIFFERENTIAL/PLATELET - Abnormal; Notable for the  following components:   Hemoglobin 12.8 (*)    All other components within normal limits  PROTIME-INR  TYPE AND SCREEN  ABO/RH    EKG None  Radiology No results found.  Procedures Anoscopy  Date/Time: 09/27/2022 10:36 PM  Performed by: Gwyneth Sprout, MD Authorized by: Gwyneth Sprout, MD   Consent:    Consent obtained:  Verbal   Consent given by:  Patient   Alternatives discussed:  No treatment Procedure details:    Internal hemorrhoids: yes     Internal hemorrhoid position:  Seven o'clock   Inflammation: no     Anal fissures: no     Anal fistulae: no     Anal stricture: no     Abscess: no     Tearing: no     Blood in rectal vault: yes     Bleeding site:  Scant amt of dried blood Post-procedure details:    Procedure completion:  Tolerated well, no immediate complications     Medications Ordered in ED Medications  lidocaine (XYLOCAINE) 2 % jelly 1 Application (1 Application Other Given 09/27/22 2212)    ED Course/ Medical Decision Making/ A&P                             Medical Decision Making Amount and/or Complexity of Data Reviewed External Data Reviewed: notes.    Details: Urgent care Labs: ordered. Decision-making details documented in ED Course.  Risk Prescription drug management.   Pt with multiple medical problems and comorbidities and presenting today with a complaint that caries a high risk for morbidity and mortality.  Here today with rectal bleeding.  Patient's bleeding is otherwise asymptomatic.  Bright red and present when he wipes.  No rectal pain.  Prior history of some minimal bleeding in the past but nothing is much as today.  He does sit a lot for his job but denies history of constipation.  He takes no anticoagulation.  Concern for internal hemorrhoids versus lower GI bleeding such as diverticulosis or AVM.  Patient does have prior history of gastric bypass but stool has not been black in nature has no abdominal pain and low suspicion  for upper intestinal ulcers.  On exam with anoscope and internal hemorrhoid is seen at 7:00 it is not currently bleeding at this time but there is some dried blood in the vault.  Discussed this with the patient.  I independently interpreted his labs and CBC today is stable with a hemoglobin of 12 which is better than prior results, AMP and INR within normal limits.  Patient has no abdominal  pain at this time.  Vital signs are normal and he feels normal.  Discussed with him that I cannot 100% rule out a distal bleed such as AVM or diverticulosis but suspect is most likely internal hemorrhoids.  Will give Anusol suppositories and gave follow-up for GI.  Gave strict return precautions.  Patient is comfortable with this plan and at this time is stable for discharge.         Final Clinical Impression(s) / ED Diagnoses Final diagnoses:  Rectal bleeding    Rx / DC Orders ED Discharge Orders          Ordered    hydrocortisone (ANUSOL-HC) 25 MG suppository  2 times daily        09/27/22 2232              Gwyneth Sprout, MD 09/27/22 2240

## 2022-09-27 NOTE — ED Provider Notes (Signed)
[facility]  Note:  This document was prepared using Conservation officer, historic buildings and may include unintentional dictation errors.  MRN: [id number] DOB: December 24, 1980  Subjective:   Vincent Diaz is a 42 y.o. male presenting for 1 day history of bright red blood per rectum, significant bloody stools. Has had 3 bowel movements without pain. Denies dysuria, hematuria, urinary frequency, penile discharge, penile swelling, testicular pain, testicular swelling, anal pain, groin pain. Has a history of a gastric bypass in 2010. No history of constipation, is pretty regular, does not strain. No heavy lifting. No history of hemorrhoids, GI disorders.    Current Facility-Administered Medications:    cyanocobalamin ((VITAMIN B-12)) injection 1,000 mcg, 1,000 mcg, Intramuscular, Q30 days, [name], MD, 1,000 mcg at 03/20/16 1457  Current Outpatient Medications:    amitriptyline (ELAVIL) 25 MG tablet, Take 75 mg by mouth at bedtime., Disp: , Rfl:    amoxicillin (AMOXIL) 250 MG capsule, Take 250 mg by mouth 3 (three) times daily., Disp: , Rfl:    cyanocobalamin (,VITAMIN B-12,) 1000 MCG/ML injection, Inject 1 mL (1,000 mcg total) into the muscle every 30 (thirty) days., Disp: 1 mL, Rfl: 0   SUMAtriptan (IMITREX) 100 MG tablet, Take 100 mg by mouth every 2 (two) hours as needed for migraine or headache., Disp: , Rfl:    No Known Allergies  Past Medical History:  Diagnosis Date   ED (erectile dysfunction)    Headache    History of chicken pox    OSA (obstructive sleep apnea) 2012   mild, AHI 5.51 during total sleep time, 13.87 during REM sleep, rec CPAP (Dr. Mayford Knife)   Premature ejaculation    Dr. 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 lbs   Vitamin B12 deficiency    Vitamin D deficiency      Past Surgical History:  Procedure Laterality Date   GASTRIC BYPASS  2010   Roux en Y ([place])    Family History  Problem Relation Age of  Onset   Hypertension Paternal Grandmother    Cancer Maternal Aunt        breast   Drug abuse Mother        h/o   Cancer Mother        under evaluation for pancreatic cancer   Cancer Maternal Uncle 29       prostate (age 80-60s), some deceased   Diabetes Neg Hx    CAD Neg Hx    Stroke Neg Hx     Social History   Tobacco Use   Smoking status: Never   Smokeless tobacco: Never  Vaping Use   Vaping Use: Never used  Substance Use Topics   Alcohol use: Yes    Comment: occ   Drug use: No    ROS   Objective:   Vitals: BP (!) 142/89 (BP Location: Left Arm)   Pulse 68   Temp 98.7 F (37.1 C) (Oral)   Resp 20   SpO2 98%   Physical Exam Constitutional:      General: He is not in acute distress.    Appearance: Normal appearance. He is well-developed and normal weight. He is not ill-appearing, toxic-appearing or diaphoretic.  HENT:     Head: Normocephalic and atraumatic.     Right Ear: External ear normal.     Left Ear: External ear normal.     Nose: Nose normal.     Mouth/Throat:  Pharynx: Oropharynx is clear.  Eyes:     General: No scleral icterus.       Right eye: No discharge.        Left eye: No discharge.     Extraocular Movements: Extraocular movements intact.  Cardiovascular:     Rate and Rhythm: Normal rate.  Pulmonary:     Effort: Pulmonary effort is normal.  Abdominal:     General: Bowel sounds are normal. There is no distension.     Palpations: Abdomen is soft. There is no mass.     Tenderness: There is no abdominal tenderness. There is no right CVA tenderness, left CVA tenderness, guarding or rebound.  Genitourinary:    Rectum: No mass, tenderness, anal fissure or external hemorrhoid.  Musculoskeletal:     Cervical back: Normal range of motion.  Neurological:     Mental Status: He is alert and oriented to person, place, and time.  Psychiatric:        Mood and Affect: Mood normal.        Behavior: Behavior normal.        Thought Content:  Thought content normal.        Judgment: Judgment normal.     Assessment and Plan :   PDMP not reviewed this encounter.  1. Bloody stools   2. Bright red blood per rectum   3. History of gastric bypass    Patient is in need of further evaluation and intervention than we can provide in the urgent care setting.  Concern is for gastrointestinal bleeding given significant bloody stools today and a normal perianal exam.  Has a history of a gastric bypass and emphasized need for further evaluation through the emergency room.  Patient is in agreement, will present by personal vehicle as he is hemodynamically stable.   Wallis Bamberg, [place] 09/27/22 1746

## 2022-09-27 NOTE — ED Triage Notes (Signed)
Pt c/o bloody stools started ~3am-also c/o "knot" to left side of neck x 5 days-NAD-steady gait

## 2023-02-14 IMAGING — MR MR LUMBAR SPINE WO/W CM
4 of 7 series · 19 of 48 positions shown · IV contrast (10 GAD)
Comparison: None.

CLINICAL DATA: Low back pain, progressive neurological deficit.
Infection suspected post lumbar puncture.

EXAM:
MRI LUMBAR SPINE WITHOUT AND WITH CONTRAST
TECHNIQUE: Multiplanar and multiecho pulse sequences of the lumbar spine were
obtained without and with intravenous contrast.
CONTRAST:  10mL GADAVIST GADOBUTROL 1 MMOL/ML IV SOLN

[Series 11: T2 · sagittal · 4.0mm · 0.55mm/px · 5 of 15 slices shown (1 of 2)]
[im 1/15]
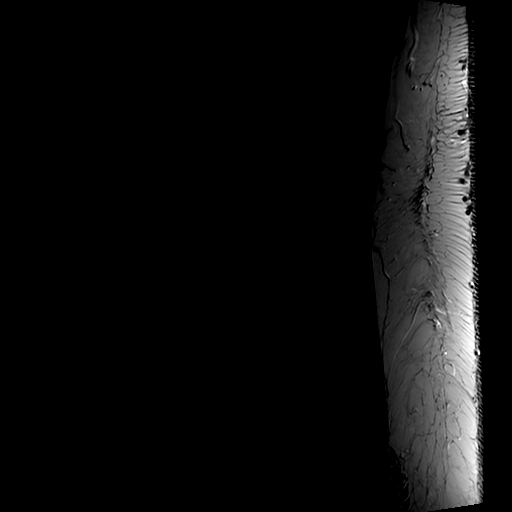
[im 4/15]
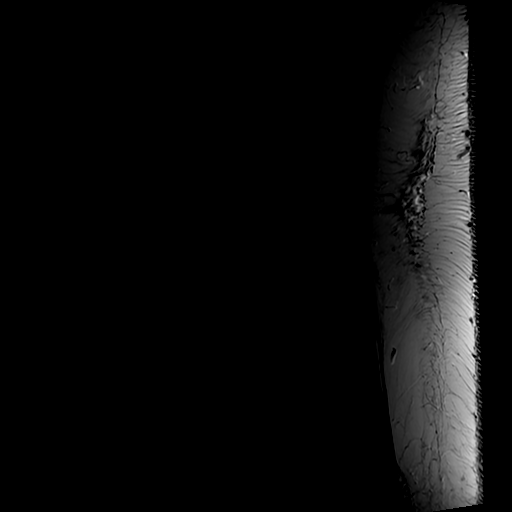
[im 8/15]
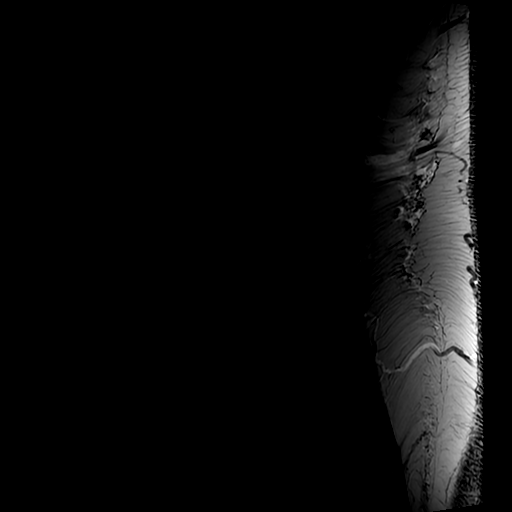
[im 11/15]
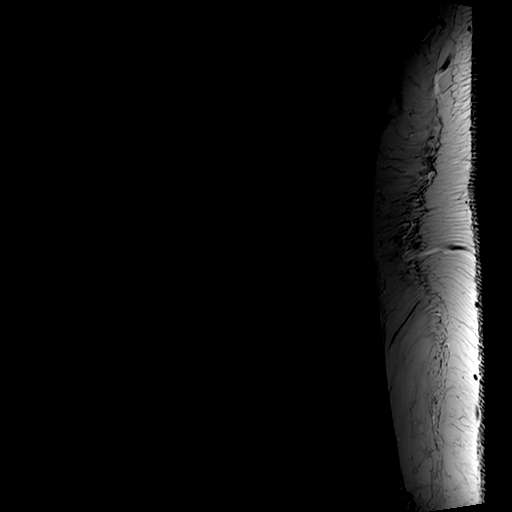
[im 15/15]
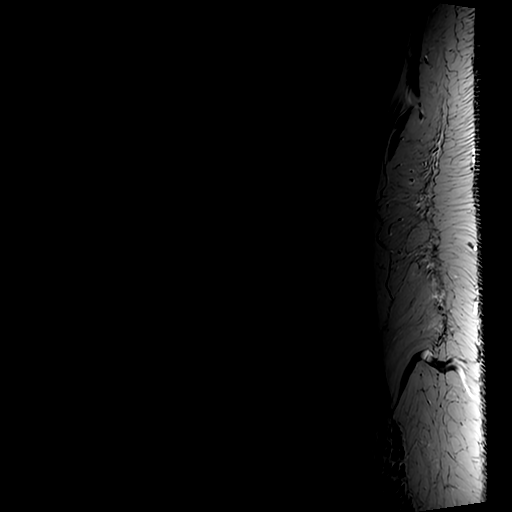

[Series 13: T1 · sagittal · 4.0mm · 0.55mm/px · 3 of 15 slices shown (1 of 2)]
[im 1/15]
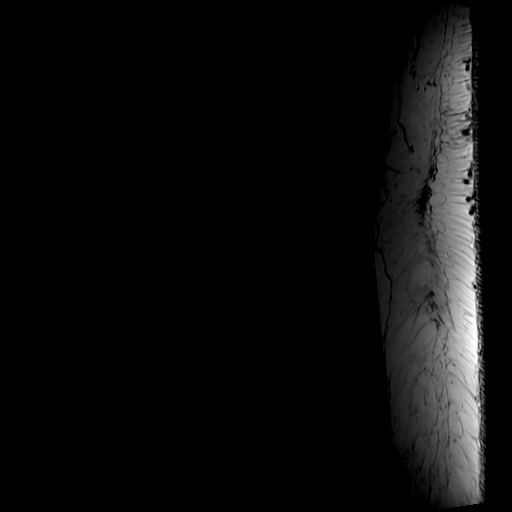
[im 10/15]
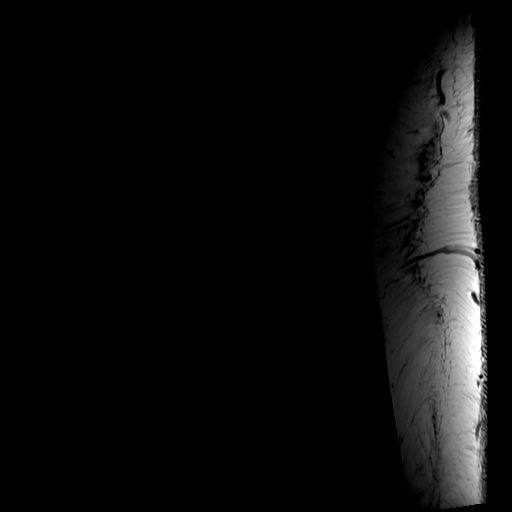
[im 15/15]
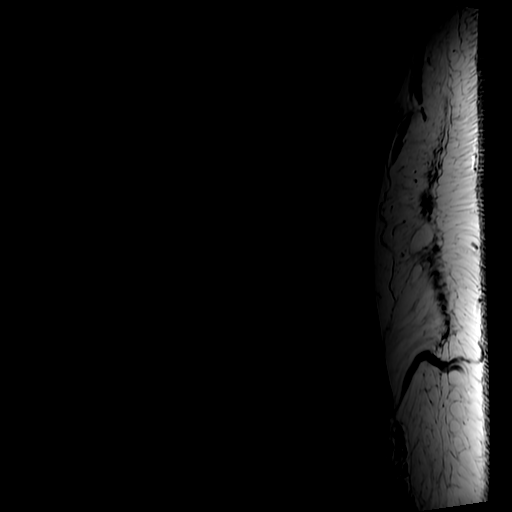

[Series 14: T2 · axial · 4.0mm · 0.39mm/px · z∈[-681,-475]mm · 8 of 37 slices shown (2 of 2)]
[im 1/37]
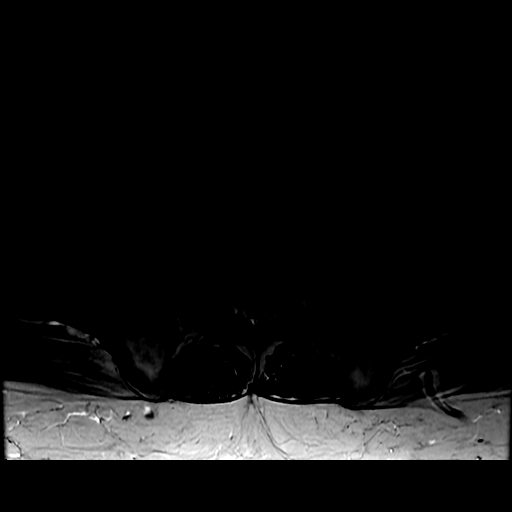
[im 5/37]
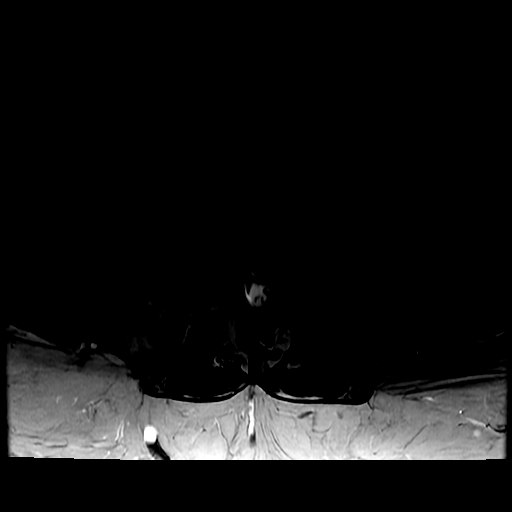
[im 13/37]
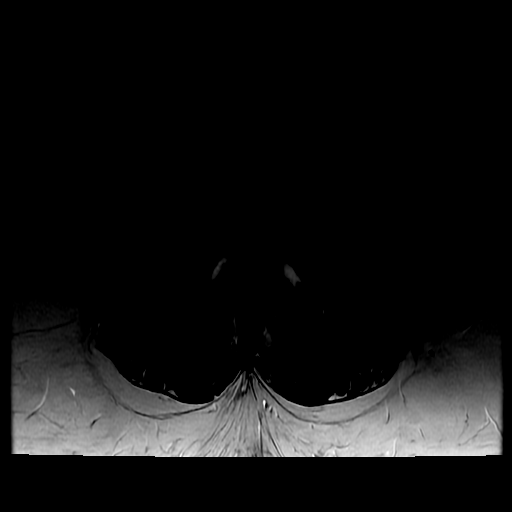
[im 17/37]
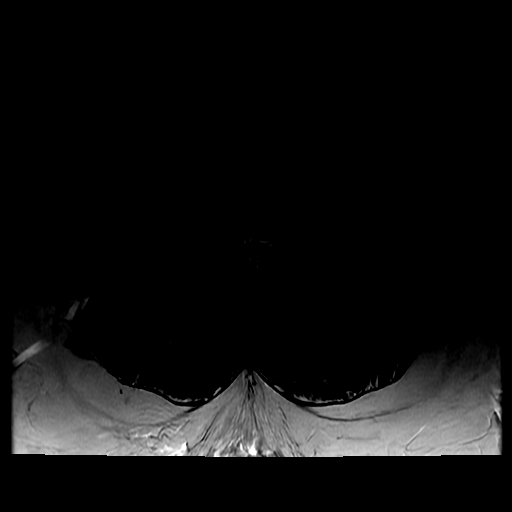
[im 21/37]
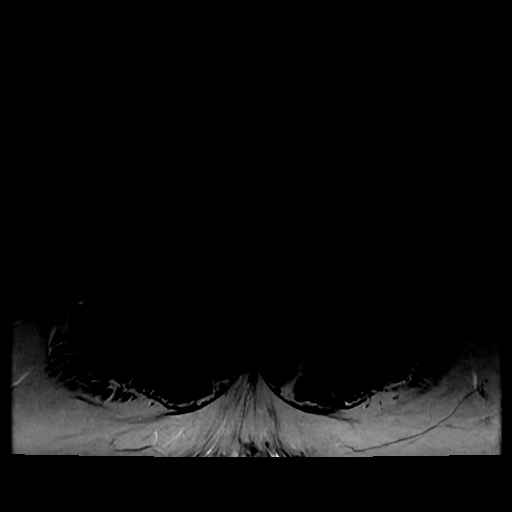
[im 25/37]
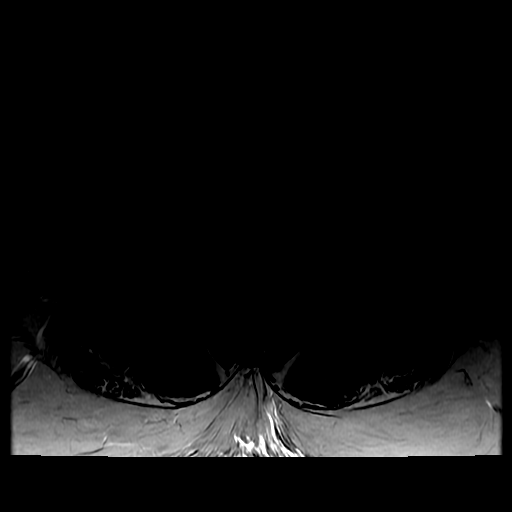
[im 33/37]
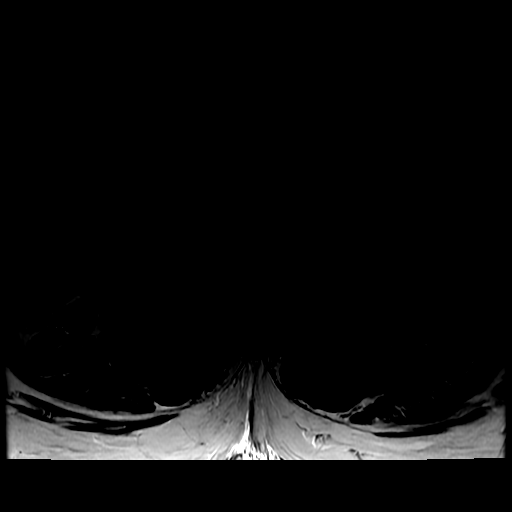
[im 37/37]
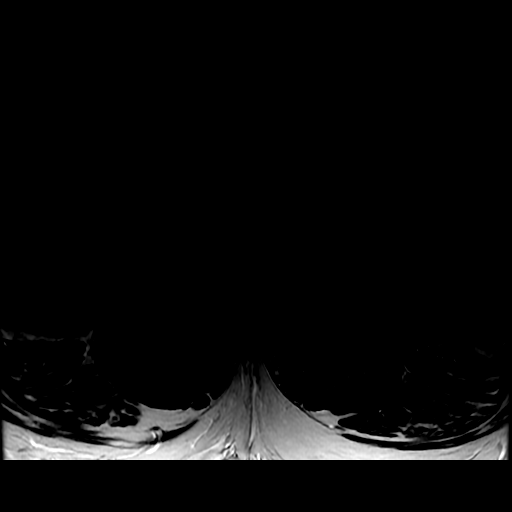

[Series 15: T1 · axial · 4.0mm · 0.39mm/px · z∈[-662,-495]mm · 3 of 37 slices shown (2 of 2)]
[im 5/37]
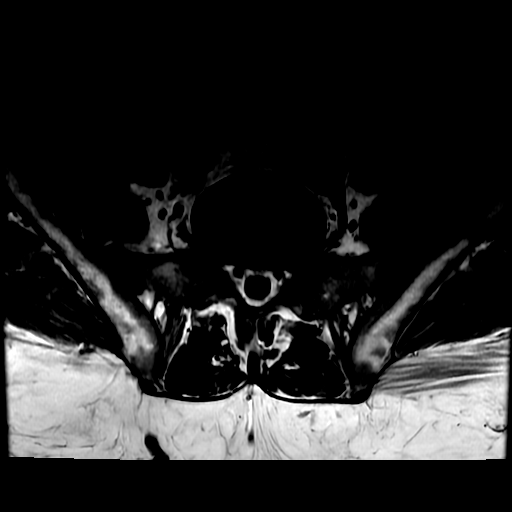
[im 21/37]
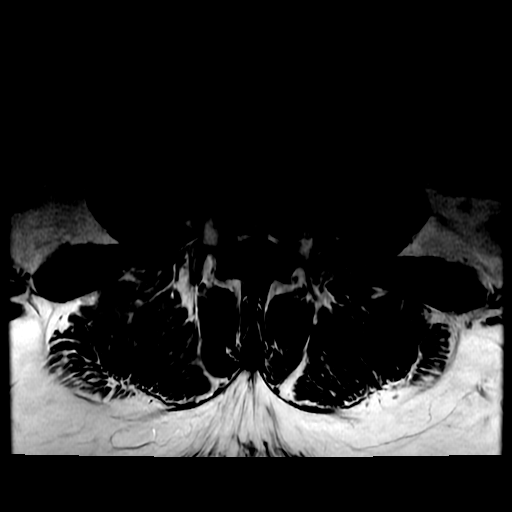
[im 33/37]
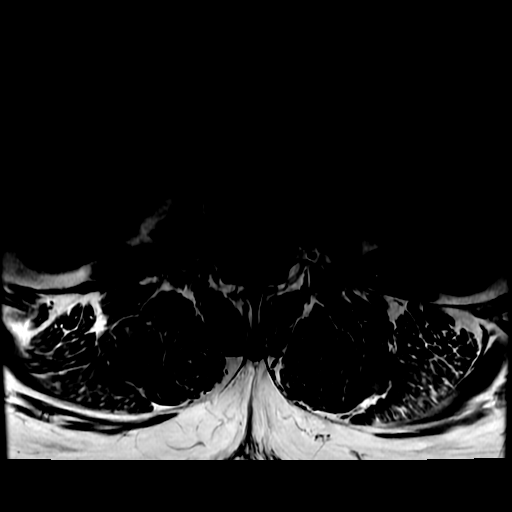

[19 of 48 positions shown; findings below may reference images not displayed]

FINDINGS: Segmentation:  Standard.

Alignment:  Physiologic.

Vertebrae: No fracture, evidence of discitis, or bone lesion.
Congenitally small spinal canal.

Conus medullaris and cauda equina: Conus extends to the L1 level.
Conus and cauda equina appear normal.

Paraspinal and other soft tissues: Negative.

Disc levels:

T12-L1: No spinal canal or neural foraminal stenosis.

L1-2: No spinal canal or neural foraminal stenosis.

L2-3: No spinal canal or neural foraminal stenosis.

L3-4: Mild facet degenerative changes. No significant spinal canal
or neural foraminal stenosis.

L4-5: Mild loss of disc height, disc bulge, moderate facet
degenerative changes with bilateral joint effusion and ligamentum
flavum redundancy which in association with the congenitally small
spinal canal results in severe spinal canal stenosis and mild
bilateral neural foraminal narrowing. Periarticular edema and
contrast enhancement is seen at the bilateral facet joints.

L5-S1: Mild facet degenerative changes. No spinal canal or neural
foraminal stenosis.
IMPRESSION: 1. No evidence of discitis/osteomyelitis or epidural abscess.
2. Degenerative changes superimposed on a congenitally small spinal
canal resulting in severe spinal canal stenosis and mild bilateral
neural foraminal narrowing at L4-L5.
3. Moderate facet degenerative changes at L4-5 with bilateral joint
effusion and surrounding edema and contrast enhancement. Findings
may be seen with degenerative facet arthritis versus septic
arthritis.

## 2023-05-22 ENCOUNTER — Other Ambulatory Visit (HOSPITAL_BASED_OUTPATIENT_CLINIC_OR_DEPARTMENT_OTHER): Payer: Self-pay

## 2023-05-22 MED ORDER — TOPIRAMATE 25 MG PO TABS
25.0000 mg | ORAL_TABLET | Freq: Every day | ORAL | 1 refills | Status: DC
  Filled 2023-05-22: qty 30, 30d supply, fill #0
  Filled 2023-06-22: qty 30, 30d supply, fill #1

## 2023-05-22 MED ORDER — PHENTERMINE HCL 37.5 MG PO TABS
ORAL_TABLET | ORAL | 1 refills | Status: DC
  Filled 2023-05-22: qty 30, 33d supply, fill #0
  Filled 2023-06-22: qty 30, 30d supply, fill #1

## 2023-06-23 ENCOUNTER — Other Ambulatory Visit (HOSPITAL_BASED_OUTPATIENT_CLINIC_OR_DEPARTMENT_OTHER): Payer: Self-pay

## 2023-06-24 ENCOUNTER — Other Ambulatory Visit: Payer: Self-pay

## 2023-07-01 ENCOUNTER — Other Ambulatory Visit (HOSPITAL_BASED_OUTPATIENT_CLINIC_OR_DEPARTMENT_OTHER): Payer: Self-pay

## 2023-07-22 ENCOUNTER — Other Ambulatory Visit (HOSPITAL_BASED_OUTPATIENT_CLINIC_OR_DEPARTMENT_OTHER): Payer: Self-pay

## 2023-07-22 MED ORDER — TOPIRAMATE 50 MG PO TABS
50.0000 mg | ORAL_TABLET | Freq: Two times a day (BID) | ORAL | 0 refills | Status: DC
  Filled 2023-07-22: qty 90, 45d supply, fill #0

## 2023-07-22 MED ORDER — PHENTERMINE HCL 37.5 MG PO TABS
ORAL_TABLET | ORAL | 0 refills | Status: DC
  Filled 2023-07-22: qty 27, 30d supply, fill #0

## 2023-08-05 ENCOUNTER — Encounter (HOSPITAL_BASED_OUTPATIENT_CLINIC_OR_DEPARTMENT_OTHER): Payer: Self-pay

## 2023-08-05 ENCOUNTER — Other Ambulatory Visit (HOSPITAL_BASED_OUTPATIENT_CLINIC_OR_DEPARTMENT_OTHER): Payer: Self-pay

## 2023-08-05 MED ORDER — TESTOSTERONE CYPIONATE 200 MG/ML IM SOLN
200.0000 mg | INTRAMUSCULAR | 0 refills | Status: DC
  Filled 2023-08-05: qty 3, 42d supply, fill #0

## 2023-08-06 ENCOUNTER — Other Ambulatory Visit (HOSPITAL_BASED_OUTPATIENT_CLINIC_OR_DEPARTMENT_OTHER): Payer: Self-pay

## 2023-08-06 MED ORDER — "BD BLUNT FILL NEEDLE 18G X 1-1/2"" MISC"
1.0000 | 0 refills | Status: AC
  Filled 2023-08-06: qty 10, 10d supply, fill #0

## 2023-08-06 MED ORDER — "NEEDLE (DISP) 23G X 1-1/2"" MISC"
1.0000 | 0 refills | Status: AC
  Filled 2023-08-06: qty 10, 10d supply, fill #0

## 2023-08-08 ENCOUNTER — Other Ambulatory Visit (HOSPITAL_BASED_OUTPATIENT_CLINIC_OR_DEPARTMENT_OTHER): Payer: Self-pay

## 2023-08-14 ENCOUNTER — Other Ambulatory Visit (HOSPITAL_BASED_OUTPATIENT_CLINIC_OR_DEPARTMENT_OTHER): Payer: Self-pay

## 2023-08-14 MED ORDER — "SYRINGE/NEEDLE (DISP) 23G X 1-1/2"" 3 ML MISC"
1.0000 | 0 refills | Status: AC
  Filled 2023-08-14: qty 10, 140d supply, fill #0

## 2023-08-25 ENCOUNTER — Other Ambulatory Visit (HOSPITAL_BASED_OUTPATIENT_CLINIC_OR_DEPARTMENT_OTHER): Payer: Self-pay

## 2023-08-27 ENCOUNTER — Other Ambulatory Visit (HOSPITAL_BASED_OUTPATIENT_CLINIC_OR_DEPARTMENT_OTHER): Payer: Self-pay

## 2023-09-03 ENCOUNTER — Other Ambulatory Visit (HOSPITAL_BASED_OUTPATIENT_CLINIC_OR_DEPARTMENT_OTHER): Payer: Self-pay

## 2023-09-03 MED ORDER — TOPIRAMATE 50 MG PO TABS
50.0000 mg | ORAL_TABLET | Freq: Two times a day (BID) | ORAL | 0 refills | Status: DC
  Filled 2023-09-03: qty 180, 90d supply, fill #0

## 2023-09-03 MED ORDER — PHENTERMINE HCL 37.5 MG PO TABS
37.5000 mg | ORAL_TABLET | Freq: Every day | ORAL | 0 refills | Status: DC
  Filled 2023-09-03: qty 30, 30d supply, fill #0

## 2023-09-16 ENCOUNTER — Other Ambulatory Visit (HOSPITAL_BASED_OUTPATIENT_CLINIC_OR_DEPARTMENT_OTHER): Payer: Self-pay

## 2023-09-16 MED ORDER — TESTOSTERONE CYPIONATE 200 MG/ML IM SOLN
200.0000 mg | INTRAMUSCULAR | 0 refills | Status: AC
  Filled 2023-09-16 – 2023-09-30 (×2): qty 5, 70d supply, fill #0

## 2023-09-21 ENCOUNTER — Encounter (HOSPITAL_BASED_OUTPATIENT_CLINIC_OR_DEPARTMENT_OTHER): Payer: Self-pay | Admitting: Emergency Medicine

## 2023-09-21 ENCOUNTER — Emergency Department (HOSPITAL_BASED_OUTPATIENT_CLINIC_OR_DEPARTMENT_OTHER)
Admission: EM | Admit: 2023-09-21 | Discharge: 2023-09-21 | Disposition: A | Discharge status: Home / Self Care | Attending: Emergency Medicine | Admitting: Emergency Medicine

## 2023-09-21 ENCOUNTER — Other Ambulatory Visit: Payer: Self-pay

## 2023-09-21 DIAGNOSIS — N4830 Priapism, unspecified: Secondary | ICD-10-CM | POA: Diagnosis present

## 2023-09-21 MED ORDER — PHENYLEPHRINE 200 MCG/ML FOR PRIAPISM / HYPOTENSION
100.0000 ug | INTRAMUSCULAR | Status: DC | PRN
  Administered 2023-09-21: 100 ug via INTRACAVERNOUS
  Filled 2023-09-21: qty 50

## 2023-09-21 MED ORDER — LIDOCAINE HCL 2 % IJ SOLN
5.0000 mL | Freq: Once | INTRAMUSCULAR | Status: AC
  Administered 2023-09-21: 100 mg
  Filled 2023-09-21: qty 20

## 2023-09-21 NOTE — ED Provider Notes (Signed)
 [place] [facility] Provider Note   CSN: [id number] Arrival date & time: 09/21/23  2952     History  Chief Complaint  Patient presents with   Priapism    Vincent Diaz is a 43 y.o. male.  HPI Patient presents with priapism.  Had a Trimix injection at about 10 PM last night.  Has had prolonged erection since then.  It is painful.  States usually goes down in an hour or 2.    Home Medications Prior to Admission medications   Medication Sig Start Date End Date Taking? Authorizing Provider  amitriptyline (ELAVIL) 25 MG tablet Take 75 mg by mouth at bedtime. 06/27/20   [provider]  amoxicillin (AMOXIL) 250 MG capsule Take 250 mg by mouth 3 (three) times daily. 07/18/20   [provider]  cyanocobalamin  (,VITAMIN B-12,) 1000 MCG/ML injection Inject 1 mL (1,000 mcg total) into the muscle every 30 (thirty) days. 02/25/16   Claire Crick, MD  hydrocortisone  (ANUSOL -HC) 25 MG suppository Place 1 suppository (25 mg total) rectally 2 (two) times daily. 09/27/22   Almond Army, MD  NEEDLE, DISP, 18 G (B-D BLUNT FILL NEEDLE) 18G X 1-1/2" MISC Use as directed every 14 (fourteen) days. 08/06/23     NEEDLE, DISP, 23 G 23G X 1-1/2" MISC Use as directed every 14 (fourteen) days. 08/06/23     phentermine  (ADIPEX-P ) 37.5 MG tablet Take 1 tablet (37.5 mg total) by mouth daily 30 minutes before breakfast. 09/03/23     SUMAtriptan (IMITREX) 100 MG tablet Take 100 mg by mouth every 2 (two) hours as needed for migraine or headache. 06/27/20   [provider]  SYRINGE-NEEDLE, DISP, 3 ML 23G X 1-1/2" 3 ML MISC Use 1 each as directed every 14 days. 08/14/23     testosterone  cypionate (DEPOTESTOSTERONE CYPIONATE) 200 MG/ML injection Inject 1 mL (200 mg total) into the muscle every 14 (fourteen) days. 09/16/23     topiramate  (TOPAMAX ) 50 MG tablet Take 1 tablet (50 mg total) by mouth 2 (two) times daily. 09/03/23         Allergies    Patient  has no known allergies.    Review of Systems   Review of Systems  Physical Exam Updated Vital Signs BP (!) 147/94   Pulse 76   Temp 98.2 F (36.8 C) (Oral)   Ht 5\' 10"  (1.778 m)   Wt 129.3 kg   SpO2 100%   BMI 40.89 kg/m  Physical Exam Vitals and nursing note reviewed.  Genitourinary:    Comments: Erect penis. Skin:    Capillary Refill: Capillary refill takes less than 2 seconds.  Neurological:     Mental Status: He is alert and oriented to person, place, and time.     ED Results / Procedures / Treatments   Labs (all labs ordered are listed, but only abnormal results are displayed) Labs Reviewed - No data to display  EKG None  Radiology No results found.  Procedures Irrigate corpus cavern, priapism  Date/Time: 09/21/2023 8:00 AM  Performed by: Mozell Arias, MD Authorized by: Mozell Arias, MD  Consent: Verbal consent obtained. Risks and benefits: risks, benefits and alternatives were discussed Consent given by: patient Patient understanding: patient states understanding of the procedure being performed Patient consent: the patient's understanding of the procedure matches consent given Procedure consent: procedure consent matches procedure scheduled Relevant documents: relevant documents present and verified Patient identity confirmed: verbally with patient Preparation: Patient was prepped and draped in  the usual sterile fashion. Local anesthesia used: yes Anesthesia: local infiltration  Anesthesia: Local anesthesia used: yes Local Anesthetic: lidocaine  2% without epinephrine Anesthetic total: 2 mL  Sedation: Patient sedated: no  Patient tolerance: patient tolerated the procedure well with no immediate complications Comments: Initial aspiration and infusion of some normal saline.  Injected on right side at about 10 o'clock position.  500 mcg of phenylephrine  infused.  Continued detumescence.  Procedure repeated on contralateral side at about 2  o'clock position.  Better flow of blood and able to aspirate approximately 50 cc of blood with detumescence.       Medications Ordered in ED Medications  phenylephrine  200 mcg / ml CONC. DILUTION INJ (ED / Urology USE ONLY) (100 mcg Intracavernosal Given by Other 09/21/23 0745)  lidocaine  (XYLOCAINE ) 2 % (with pres) injection 100 mg (100 mg Infiltration Given by Other 09/21/23 0745)    ED Course/ Medical Decision Making/ A&P                                 Medical Decision Making Risk Prescription drug management.   Patient with priapism.  Began after erectile dysfunction injection.  Has had around 9 hours of erection.  Differential diagnosis does include different causes of priapism but most likely due to the injection.  Discussed with patient decision for intracavernosal injection and aspiration.  Detumescence after.  Monitored.  Rechecked and no recurrent erection.          Final Clinical Impression(s) / ED Diagnoses Final diagnoses:  Priapism    Rx / DC Orders ED Discharge Orders     None         Mozell Arias, MD 09/21/23 [phone]

## 2023-09-21 NOTE — Discharge Instructions (Signed)
 Follow-up with your urologist to find out about further dosing.

## 2023-09-21 NOTE — ED Notes (Signed)
 Pt tolerated procedure well with good results.  2.x 2 's applied with light pressure coban

## 2023-09-21 NOTE — ED Triage Notes (Signed)
 Pt used his trimix injectable medication last night around 2200, and has had an erection since. Pt has not had any other medication.

## 2023-09-26 ENCOUNTER — Other Ambulatory Visit (HOSPITAL_BASED_OUTPATIENT_CLINIC_OR_DEPARTMENT_OTHER): Payer: Self-pay

## 2023-09-26 MED ORDER — PHENTERMINE HCL 37.5 MG PO TABS
37.5000 mg | ORAL_TABLET | Freq: Every day | ORAL | 0 refills | Status: DC
  Filled 2023-09-26 – 2023-10-01 (×3): qty 30, 30d supply, fill #0

## 2023-09-30 ENCOUNTER — Other Ambulatory Visit (HOSPITAL_BASED_OUTPATIENT_CLINIC_OR_DEPARTMENT_OTHER): Payer: Self-pay

## 2023-10-01 ENCOUNTER — Other Ambulatory Visit (HOSPITAL_BASED_OUTPATIENT_CLINIC_OR_DEPARTMENT_OTHER): Payer: Self-pay

## 2023-10-22 ENCOUNTER — Other Ambulatory Visit (HOSPITAL_BASED_OUTPATIENT_CLINIC_OR_DEPARTMENT_OTHER): Payer: Self-pay

## 2023-10-22 MED ORDER — TOPIRAMATE 50 MG PO TABS
50.0000 mg | ORAL_TABLET | Freq: Two times a day (BID) | ORAL | 0 refills | Status: DC
  Filled 2023-10-22 – 2023-12-27 (×2): qty 180, 90d supply, fill #0

## 2023-10-22 MED ORDER — PHENTERMINE HCL 37.5 MG PO TABS
37.5000 mg | ORAL_TABLET | ORAL | 2 refills | Status: DC
  Filled 2023-10-22 (×2): qty 30, 1d supply, fill #0
  Filled 2023-11-01: qty 30, 30d supply, fill #0
  Filled 2023-11-01: qty 30, 1d supply, fill #0
  Filled 2023-11-28 – 2023-11-29 (×2): qty 30, 30d supply, fill #1
  Filled 2024-01-05: qty 30, 30d supply, fill #2

## 2023-11-01 ENCOUNTER — Other Ambulatory Visit (HOSPITAL_BASED_OUTPATIENT_CLINIC_OR_DEPARTMENT_OTHER): Payer: Self-pay

## 2023-11-28 ENCOUNTER — Other Ambulatory Visit (HOSPITAL_BASED_OUTPATIENT_CLINIC_OR_DEPARTMENT_OTHER): Payer: Self-pay

## 2023-11-28 ENCOUNTER — Other Ambulatory Visit: Payer: Self-pay

## 2023-11-29 ENCOUNTER — Other Ambulatory Visit (HOSPITAL_BASED_OUTPATIENT_CLINIC_OR_DEPARTMENT_OTHER): Payer: Self-pay

## 2023-12-12 ENCOUNTER — Other Ambulatory Visit (HOSPITAL_BASED_OUTPATIENT_CLINIC_OR_DEPARTMENT_OTHER): Payer: Self-pay

## 2023-12-12 MED ORDER — CIPROFLOXACIN HCL 500 MG PO TABS
500.0000 mg | ORAL_TABLET | Freq: Two times a day (BID) | ORAL | 0 refills | Status: AC
  Filled 2023-12-12: qty 20, 10d supply, fill #0

## 2023-12-12 MED ORDER — ONDANSETRON 4 MG PO TBDP
4.0000 mg | ORAL_TABLET | Freq: Three times a day (TID) | ORAL | 0 refills | Status: DC | PRN
  Filled 2023-12-12: qty 10, 4d supply, fill #0

## 2023-12-27 ENCOUNTER — Other Ambulatory Visit (HOSPITAL_BASED_OUTPATIENT_CLINIC_OR_DEPARTMENT_OTHER): Payer: Self-pay

## 2024-01-06 ENCOUNTER — Other Ambulatory Visit (HOSPITAL_BASED_OUTPATIENT_CLINIC_OR_DEPARTMENT_OTHER): Payer: Self-pay

## 2024-01-06 ENCOUNTER — Other Ambulatory Visit: Payer: Self-pay

## 2024-01-22 ENCOUNTER — Other Ambulatory Visit: Payer: Self-pay

## 2024-01-22 ENCOUNTER — Other Ambulatory Visit (HOSPITAL_BASED_OUTPATIENT_CLINIC_OR_DEPARTMENT_OTHER): Payer: Self-pay

## 2024-01-22 MED ORDER — PHENTERMINE HCL 37.5 MG PO TABS
37.5000 mg | ORAL_TABLET | Freq: Every day | ORAL | 2 refills | Status: DC
  Filled 2024-01-22 – 2024-02-04 (×2): qty 30, 30d supply, fill #0
  Filled 2024-03-06: qty 30, 30d supply, fill #1
  Filled 2024-04-06: qty 30, 30d supply, fill #2

## 2024-01-22 MED ORDER — TOPIRAMATE 100 MG PO TABS
100.0000 mg | ORAL_TABLET | Freq: Two times a day (BID) | ORAL | 0 refills | Status: AC
  Filled 2024-01-22: qty 180, 90d supply, fill #0

## 2024-02-04 ENCOUNTER — Other Ambulatory Visit: Payer: Self-pay

## 2024-02-04 ENCOUNTER — Other Ambulatory Visit (HOSPITAL_BASED_OUTPATIENT_CLINIC_OR_DEPARTMENT_OTHER): Payer: Self-pay

## 2024-02-07 ENCOUNTER — Other Ambulatory Visit: Payer: Self-pay | Admitting: Medical Genetics

## 2024-02-24 ENCOUNTER — Other Ambulatory Visit: Payer: Self-pay

## 2024-02-24 ENCOUNTER — Other Ambulatory Visit (HOSPITAL_BASED_OUTPATIENT_CLINIC_OR_DEPARTMENT_OTHER): Payer: Self-pay

## 2024-02-24 MED ORDER — SYRINGE LUER LOCK 23G X 1-1/2" 3 ML MISC
0 refills | Status: AC
  Filled 2024-02-24: qty 6, 84d supply, fill #0

## 2024-02-24 MED ORDER — BD BLUNT FILL NEEDLE 18G X 1-1/2" MISC
0 refills | Status: AC
  Filled 2024-02-24: qty 6, 84d supply, fill #0

## 2024-02-24 MED ORDER — TESTOSTERONE CYPIONATE 200 MG/ML IM SOLN
200.0000 mg | INTRAMUSCULAR | 0 refills | Status: AC
  Filled 2024-02-24: qty 6, 84d supply, fill #0

## 2024-02-25 ENCOUNTER — Other Ambulatory Visit (HOSPITAL_BASED_OUTPATIENT_CLINIC_OR_DEPARTMENT_OTHER): Payer: Self-pay

## 2024-02-25 MED ORDER — TADALAFIL 5 MG PO TABS
5.0000 mg | ORAL_TABLET | Freq: Every day | ORAL | 11 refills | Status: AC
  Filled 2024-02-25: qty 30, 30d supply, fill #0
  Filled 2024-03-20: qty 30, 30d supply, fill #1
  Filled 2024-05-04: qty 30, 30d supply, fill #2

## 2024-03-05 ENCOUNTER — Other Ambulatory Visit: Payer: Self-pay

## 2024-03-05 ENCOUNTER — Other Ambulatory Visit (HOSPITAL_BASED_OUTPATIENT_CLINIC_OR_DEPARTMENT_OTHER): Payer: Self-pay

## 2024-03-05 MED ORDER — VITAMIN D (ERGOCALCIFEROL) 1.25 MG (50000 UNIT) PO CAPS
50000.0000 [IU] | ORAL_CAPSULE | ORAL | 0 refills | Status: AC
  Filled 2024-03-05: qty 12, 84d supply, fill #0

## 2024-03-06 ENCOUNTER — Other Ambulatory Visit: Payer: Self-pay

## 2024-03-06 ENCOUNTER — Other Ambulatory Visit (HOSPITAL_BASED_OUTPATIENT_CLINIC_OR_DEPARTMENT_OTHER): Payer: Self-pay

## 2024-03-12 ENCOUNTER — Other Ambulatory Visit

## 2024-03-12 DIAGNOSIS — Z006 Encounter for examination for normal comparison and control in clinical research program: Secondary | ICD-10-CM

## 2024-03-20 ENCOUNTER — Other Ambulatory Visit (HOSPITAL_BASED_OUTPATIENT_CLINIC_OR_DEPARTMENT_OTHER): Payer: Self-pay

## 2024-03-21 LAB — GENECONNECT MOLECULAR SCREEN: Genetic Analysis Overall Interpretation: NEGATIVE

## 2024-03-31 ENCOUNTER — Other Ambulatory Visit (HOSPITAL_BASED_OUTPATIENT_CLINIC_OR_DEPARTMENT_OTHER): Payer: Self-pay

## 2024-04-06 ENCOUNTER — Other Ambulatory Visit: Payer: Self-pay

## 2024-04-06 ENCOUNTER — Other Ambulatory Visit (HOSPITAL_BASED_OUTPATIENT_CLINIC_OR_DEPARTMENT_OTHER): Payer: Self-pay

## 2024-04-29 ENCOUNTER — Other Ambulatory Visit (HOSPITAL_BASED_OUTPATIENT_CLINIC_OR_DEPARTMENT_OTHER): Payer: Self-pay

## 2024-04-29 ENCOUNTER — Other Ambulatory Visit: Payer: Self-pay

## 2024-04-29 MED ORDER — PHENTERMINE HCL 37.5 MG PO TABS
37.5000 mg | ORAL_TABLET | Freq: Every day | ORAL | 2 refills | Status: DC
  Filled 2024-04-29 – 2024-05-14 (×2): qty 30, 30d supply, fill #0

## 2024-04-29 MED ORDER — TOPIRAMATE 100 MG PO TABS
100.0000 mg | ORAL_TABLET | Freq: Two times a day (BID) | ORAL | 0 refills | Status: DC
  Filled 2024-04-29: qty 180, 90d supply, fill #0

## 2024-05-04 ENCOUNTER — Other Ambulatory Visit (HOSPITAL_BASED_OUTPATIENT_CLINIC_OR_DEPARTMENT_OTHER): Payer: Self-pay

## 2024-05-12 ENCOUNTER — Other Ambulatory Visit (HOSPITAL_BASED_OUTPATIENT_CLINIC_OR_DEPARTMENT_OTHER): Payer: Self-pay

## 2024-05-15 ENCOUNTER — Other Ambulatory Visit (HOSPITAL_BASED_OUTPATIENT_CLINIC_OR_DEPARTMENT_OTHER): Payer: Self-pay

## 2024-05-29 ENCOUNTER — Other Ambulatory Visit (HOSPITAL_BASED_OUTPATIENT_CLINIC_OR_DEPARTMENT_OTHER): Payer: Self-pay

## 2024-05-29 MED ORDER — WEGOVY 1.5 MG PO TABS
1.5000 mg | ORAL_TABLET | ORAL | 0 refills | Status: AC
  Filled 2024-05-29 – 2024-06-19 (×2): qty 30, 30d supply, fill #0

## 2024-06-01 ENCOUNTER — Other Ambulatory Visit (HOSPITAL_BASED_OUTPATIENT_CLINIC_OR_DEPARTMENT_OTHER): Payer: Self-pay

## 2024-06-08 ENCOUNTER — Other Ambulatory Visit (HOSPITAL_BASED_OUTPATIENT_CLINIC_OR_DEPARTMENT_OTHER): Payer: Self-pay

## 2024-06-10 ENCOUNTER — Other Ambulatory Visit (HOSPITAL_BASED_OUTPATIENT_CLINIC_OR_DEPARTMENT_OTHER): Payer: Self-pay

## 2024-06-10 ENCOUNTER — Other Ambulatory Visit: Payer: Self-pay

## 2024-06-10 MED ORDER — "SYRINGE 23G X 1"" 3 ML MISC"
5 refills | Status: AC
  Filled 2024-06-10: qty 10, 30d supply, fill #0

## 2024-06-10 MED ORDER — "BD BLUNT FILL NEEDLE 18G X 1-1/2"" MISC"
0 refills | Status: AC
  Filled 2024-06-10: qty 6, 84d supply, fill #0

## 2024-06-10 MED ORDER — TESTOSTERONE CYPIONATE 200 MG/ML IM SOLN
INTRAMUSCULAR | 0 refills | Status: AC
  Filled 2024-06-10 (×2): qty 6, 84d supply, fill #0

## 2024-06-11 ENCOUNTER — Other Ambulatory Visit (HOSPITAL_BASED_OUTPATIENT_CLINIC_OR_DEPARTMENT_OTHER): Payer: Self-pay

## 2024-06-12 ENCOUNTER — Other Ambulatory Visit (HOSPITAL_BASED_OUTPATIENT_CLINIC_OR_DEPARTMENT_OTHER): Payer: Self-pay

## 2024-06-15 ENCOUNTER — Other Ambulatory Visit (HOSPITAL_BASED_OUTPATIENT_CLINIC_OR_DEPARTMENT_OTHER): Payer: Self-pay

## 2024-06-17 ENCOUNTER — Other Ambulatory Visit (HOSPITAL_BASED_OUTPATIENT_CLINIC_OR_DEPARTMENT_OTHER): Payer: Self-pay

## 2024-06-18 ENCOUNTER — Other Ambulatory Visit (HOSPITAL_BASED_OUTPATIENT_CLINIC_OR_DEPARTMENT_OTHER): Payer: Self-pay

## 2024-06-19 ENCOUNTER — Other Ambulatory Visit (HOSPITAL_BASED_OUTPATIENT_CLINIC_OR_DEPARTMENT_OTHER): Payer: Self-pay

## 2024-06-19 ENCOUNTER — Other Ambulatory Visit: Payer: Self-pay
